# Patient Record
Sex: Female | Born: 1940 | Race: White | Hispanic: No | Smoking: Former smoker
Health system: Southern US, Community
[De-identification: ages and names within clinical notes are randomized; demographics above are authoritative.]

## PROBLEM LIST (undated history)

## (undated) DIAGNOSIS — C801 Malignant (primary) neoplasm, unspecified: Secondary | ICD-10-CM

## (undated) DIAGNOSIS — I350 Nonrheumatic aortic (valve) stenosis: Secondary | ICD-10-CM

## (undated) DIAGNOSIS — E669 Obesity, unspecified: Secondary | ICD-10-CM

## (undated) DIAGNOSIS — E7800 Pure hypercholesterolemia, unspecified: Secondary | ICD-10-CM

## (undated) DIAGNOSIS — T8859XA Other complications of anesthesia, initial encounter: Secondary | ICD-10-CM

## (undated) DIAGNOSIS — Z9289 Personal history of other medical treatment: Secondary | ICD-10-CM

## (undated) DIAGNOSIS — I10 Essential (primary) hypertension: Secondary | ICD-10-CM

## (undated) DIAGNOSIS — Z8042 Family history of malignant neoplasm of prostate: Secondary | ICD-10-CM

## (undated) DIAGNOSIS — M329 Systemic lupus erythematosus, unspecified: Secondary | ICD-10-CM

## (undated) DIAGNOSIS — R011 Cardiac murmur, unspecified: Secondary | ICD-10-CM

## (undated) DIAGNOSIS — Z9889 Other specified postprocedural states: Secondary | ICD-10-CM

## (undated) DIAGNOSIS — IMO0002 Reserved for concepts with insufficient information to code with codable children: Secondary | ICD-10-CM

## (undated) DIAGNOSIS — Z800 Family history of malignant neoplasm of digestive organs: Secondary | ICD-10-CM

## (undated) DIAGNOSIS — N189 Chronic kidney disease, unspecified: Secondary | ICD-10-CM

## (undated) DIAGNOSIS — I639 Cerebral infarction, unspecified: Secondary | ICD-10-CM

## (undated) DIAGNOSIS — Z803 Family history of malignant neoplasm of breast: Secondary | ICD-10-CM

## (undated) DIAGNOSIS — R519 Headache, unspecified: Secondary | ICD-10-CM

## (undated) DIAGNOSIS — J942 Hemothorax: Secondary | ICD-10-CM

## (undated) DIAGNOSIS — E039 Hypothyroidism, unspecified: Secondary | ICD-10-CM

## (undated) DIAGNOSIS — H9190 Unspecified hearing loss, unspecified ear: Secondary | ICD-10-CM

## (undated) DIAGNOSIS — I4891 Unspecified atrial fibrillation: Secondary | ICD-10-CM

## (undated) HISTORY — PX: HERNIA REPAIR: SHX51

## (undated) HISTORY — DX: Pure hypercholesterolemia, unspecified: E78.00

## (undated) HISTORY — PX: CHOLECYSTECTOMY: SHX55

## (undated) HISTORY — DX: Family history of malignant neoplasm of prostate: Z80.42

## (undated) HISTORY — PX: OTHER SURGICAL HISTORY: SHX169

## (undated) HISTORY — DX: Obesity, unspecified: E66.9

## (undated) HISTORY — DX: Family history of malignant neoplasm of breast: Z80.3

## (undated) HISTORY — DX: Unspecified atrial fibrillation: I48.91

## (undated) HISTORY — DX: Family history of malignant neoplasm of digestive organs: Z80.0

## (undated) HISTORY — DX: Hypothyroidism, unspecified: E03.9

## (undated) HISTORY — PX: TOE SURGERY: SHX1073

---

## 1999-12-24 ENCOUNTER — Other Ambulatory Visit: Admission: RE | Admit: 1999-12-24 | Discharge: 1999-12-24 | Payer: Self-pay | Admitting: Ophthalmology

## 2001-02-27 ENCOUNTER — Other Ambulatory Visit: Admission: RE | Admit: 2001-02-27 | Discharge: 2001-02-27 | Payer: Self-pay | Admitting: Family Medicine

## 2003-07-23 ENCOUNTER — Other Ambulatory Visit: Admission: RE | Admit: 2003-07-23 | Discharge: 2003-07-23 | Payer: Self-pay | Admitting: Family Medicine

## 2005-05-12 ENCOUNTER — Other Ambulatory Visit: Admission: RE | Admit: 2005-05-12 | Discharge: 2005-05-12 | Payer: Self-pay | Admitting: Family Medicine

## 2009-05-13 ENCOUNTER — Inpatient Hospital Stay (HOSPITAL_COMMUNITY): Admission: EM | Admit: 2009-05-13 | Discharge: 2009-05-15 | Payer: Self-pay | Admitting: Emergency Medicine

## 2009-05-13 ENCOUNTER — Ambulatory Visit: Payer: Self-pay | Admitting: Cardiology

## 2009-05-13 DIAGNOSIS — I4891 Unspecified atrial fibrillation: Secondary | ICD-10-CM

## 2009-05-13 HISTORY — DX: Unspecified atrial fibrillation: I48.91

## 2009-05-14 ENCOUNTER — Encounter (INDEPENDENT_AMBULATORY_CARE_PROVIDER_SITE_OTHER): Payer: Self-pay | Admitting: Internal Medicine

## 2009-06-25 ENCOUNTER — Ambulatory Visit: Payer: Self-pay | Admitting: Thoracic Surgery

## 2009-06-25 ENCOUNTER — Inpatient Hospital Stay (HOSPITAL_COMMUNITY): Admission: AD | Admit: 2009-06-25 | Discharge: 2009-07-02 | Payer: Self-pay | Admitting: Interventional Cardiology

## 2009-06-27 ENCOUNTER — Encounter (INDEPENDENT_AMBULATORY_CARE_PROVIDER_SITE_OTHER): Payer: Self-pay | Admitting: Interventional Cardiology

## 2009-07-08 ENCOUNTER — Ambulatory Visit: Payer: Self-pay | Admitting: Thoracic Surgery

## 2009-07-08 ENCOUNTER — Encounter: Admission: RE | Admit: 2009-07-08 | Discharge: 2009-07-08 | Payer: Self-pay | Admitting: Thoracic Surgery

## 2009-07-30 ENCOUNTER — Ambulatory Visit: Payer: Self-pay | Admitting: Thoracic Surgery

## 2009-07-30 ENCOUNTER — Encounter: Admission: RE | Admit: 2009-07-30 | Discharge: 2009-07-30 | Payer: Self-pay | Admitting: Thoracic Surgery

## 2009-10-01 ENCOUNTER — Encounter: Admission: RE | Admit: 2009-10-01 | Discharge: 2009-10-01 | Payer: Self-pay | Admitting: Thoracic Surgery

## 2009-10-01 ENCOUNTER — Ambulatory Visit: Payer: Self-pay | Admitting: Thoracic Surgery

## 2010-07-26 LAB — CBC
HCT: 34.9 % — ABNORMAL LOW (ref 36.0–46.0)
HCT: 36.5 % (ref 36.0–46.0)
Hemoglobin: 12.7 g/dL (ref 12.0–15.0)
MCV: 94.8 fL (ref 78.0–100.0)
MCV: 94.9 fL (ref 78.0–100.0)
MCV: 95.1 fL (ref 78.0–100.0)
MCV: 95.6 fL (ref 78.0–100.0)
Platelets: 281 10*3/uL (ref 150–400)
Platelets: 337 10*3/uL (ref 150–400)
RBC: 3.44 MIL/uL — ABNORMAL LOW (ref 3.87–5.11)
RBC: 3.67 MIL/uL — ABNORMAL LOW (ref 3.87–5.11)
RBC: 3.85 MIL/uL — ABNORMAL LOW (ref 3.87–5.11)
RDW: 13.2 % (ref 11.5–15.5)
WBC: 4.8 10*3/uL (ref 4.0–10.5)
WBC: 5.1 10*3/uL (ref 4.0–10.5)
WBC: 6.3 10*3/uL (ref 4.0–10.5)
WBC: 8 10*3/uL (ref 4.0–10.5)

## 2010-07-26 LAB — BASIC METABOLIC PANEL
BUN: 15 mg/dL (ref 6–23)
CO2: 28 mEq/L (ref 19–32)
Calcium: 8.8 mg/dL (ref 8.4–10.5)
Chloride: 100 mEq/L (ref 96–112)
Chloride: 103 mEq/L (ref 96–112)
Creatinine, Ser: 0.75 mg/dL (ref 0.4–1.2)
GFR calc Af Amer: >60 mL/min (ref >60)
GFR calc non Af Amer: >60 mL/min (ref >60)
Glucose, Bld: 131 mg/dL — ABNORMAL HIGH (ref 70–99)
Potassium: 3.2 mEq/L — ABNORMAL LOW (ref 3.5–5.1)
Sodium: 143 mEq/L (ref 135–145)

## 2010-07-26 LAB — PROTIME-INR
INR: 0.99 (ref 0.00–1.49)
Prothrombin Time: 13 seconds (ref 11.6–15.2)

## 2010-07-26 LAB — COMPREHENSIVE METABOLIC PANEL
AST: 21 U/L (ref 0–37)
Albumin: 2.6 g/dL — ABNORMAL LOW (ref 3.5–5.2)
BUN: 13 mg/dL (ref 6–23)
CO2: 28 mEq/L (ref 19–32)
Calcium: 8.8 mg/dL (ref 8.4–10.5)
Creatinine, Ser: 0.65 mg/dL (ref 0.4–1.2)
GFR calc Af Amer: >60 mL/min (ref >60)
GFR calc non Af Amer: >60 mL/min (ref >60)

## 2010-07-26 LAB — DIFFERENTIAL
Basophils Relative: 0 % (ref 0–1)
Eosinophils Absolute: 0 10*3/uL (ref 0.0–0.7)
Eosinophils Relative: 1 % (ref 0–5)
Lymphs Abs: 1.3 10*3/uL (ref 0.7–4.0)
Monocytes Relative: 13 % — ABNORMAL HIGH (ref 3–12)

## 2010-07-26 LAB — CK TOTAL AND CKMB (NOT AT ARMC): CK, MB: 4.7 ng/mL — ABNORMAL HIGH (ref 0.3–4.0)

## 2010-07-26 LAB — CARDIAC PANEL(CRET KIN+CKTOT+MB+TROPI)
Relative Index: 3.9 — ABNORMAL HIGH (ref 0.0–2.5)
Total CK: 105 U/L (ref 7–177)
Troponin I: 0.06 ng/mL (ref 0.00–0.06)

## 2010-07-26 LAB — LIPID PANEL
HDL: 43 mg/dL (ref >39)
LDL Cholesterol: 77 mg/dL (ref 0–99)
Triglycerides: 58 mg/dL (ref <150)

## 2010-07-26 LAB — MAGNESIUM: Magnesium: 2.1 mg/dL (ref 1.5–2.5)

## 2010-07-26 LAB — PHOSPHORUS: Phosphorus: 3.7 mg/dL (ref 2.3–4.6)

## 2010-07-30 LAB — CULTURE, RESPIRATORY W GRAM STAIN
Culture: NO GROWTH
Gram Stain: NONE SEEN

## 2010-07-30 LAB — CBC
HCT: 26.3 % — ABNORMAL LOW (ref 36.0–46.0)
HCT: 28.1 % — ABNORMAL LOW (ref 36.0–46.0)
HCT: 29.9 % — ABNORMAL LOW (ref 36.0–46.0)
HCT: 31.4 % — ABNORMAL LOW (ref 36.0–46.0)
Hemoglobin: 10.6 g/dL — ABNORMAL LOW (ref 12.0–15.0)
Hemoglobin: 9.5 g/dL — ABNORMAL LOW (ref 12.0–15.0)
MCHC: 33.6 g/dL (ref 30.0–36.0)
MCHC: 33.8 g/dL (ref 30.0–36.0)
MCHC: 34.2 g/dL (ref 30.0–36.0)
MCV: 87.2 fL (ref 78.0–100.0)
MCV: 87.4 fL (ref 78.0–100.0)
MCV: 88.8 fL (ref 78.0–100.0)
Platelets: 536 10*3/uL — ABNORMAL HIGH (ref 150–400)
Platelets: 575 10*3/uL — ABNORMAL HIGH (ref 150–400)
Platelets: 599 10*3/uL — ABNORMAL HIGH (ref 150–400)
RBC: 3.23 MIL/uL — ABNORMAL LOW (ref 3.87–5.11)
RBC: 3.25 MIL/uL — ABNORMAL LOW (ref 3.87–5.11)
RBC: 3.54 MIL/uL — ABNORMAL LOW (ref 3.87–5.11)
RDW: 17.5 % — ABNORMAL HIGH (ref 11.5–15.5)
RDW: 17.5 % — ABNORMAL HIGH (ref 11.5–15.5)
RDW: 17.9 % — ABNORMAL HIGH (ref 11.5–15.5)
RDW: 18.1 % — ABNORMAL HIGH (ref 11.5–15.5)
WBC: 10.3 10*3/uL (ref 4.0–10.5)
WBC: 10.6 10*3/uL — ABNORMAL HIGH (ref 4.0–10.5)
WBC: 11.3 10*3/uL — ABNORMAL HIGH (ref 4.0–10.5)
WBC: 12.6 10*3/uL — ABNORMAL HIGH (ref 4.0–10.5)

## 2010-07-30 LAB — CROSSMATCH

## 2010-07-30 LAB — BLOOD GAS, ARTERIAL
Bicarbonate: 27.6 mEq/L — ABNORMAL HIGH (ref 20.0–24.0)
O2 Saturation: 96.5 %
pCO2 arterial: 43 mmHg (ref 35.0–45.0)
pO2, Arterial: 76.1 mmHg — ABNORMAL LOW (ref 80.0–100.0)

## 2010-07-30 LAB — COMPREHENSIVE METABOLIC PANEL
AST: 28 U/L (ref 0–37)
Albumin: 1.8 g/dL — ABNORMAL LOW (ref 3.5–5.2)
Albumin: 2 g/dL — ABNORMAL LOW (ref 3.5–5.2)
Alkaline Phosphatase: 46 U/L (ref 39–117)
BUN: 13 mg/dL (ref 6–23)
BUN: 18 mg/dL (ref 6–23)
CO2: 30 mEq/L (ref 19–32)
Calcium: 8.2 mg/dL — ABNORMAL LOW (ref 8.4–10.5)
Chloride: 101 mEq/L (ref 96–112)
Chloride: 102 mEq/L (ref 96–112)
Creatinine, Ser: 0.92 mg/dL (ref 0.4–1.2)
Creatinine, Ser: 0.93 mg/dL (ref 0.4–1.2)
GFR calc Af Amer: >60 mL/min (ref >60)
GFR calc non Af Amer: 60 mL/min — ABNORMAL LOW (ref >60)
GFR calc non Af Amer: >60 mL/min (ref >60)
Potassium: 3.9 mEq/L (ref 3.5–5.1)
Total Bilirubin: 0.4 mg/dL (ref 0.3–1.2)
Total Bilirubin: 0.8 mg/dL (ref 0.3–1.2)

## 2010-07-30 LAB — GLUCOSE, CAPILLARY
Glucose-Capillary: 102 mg/dL — ABNORMAL HIGH (ref 70–99)
Glucose-Capillary: 114 mg/dL — ABNORMAL HIGH (ref 70–99)
Glucose-Capillary: 117 mg/dL — ABNORMAL HIGH (ref 70–99)
Glucose-Capillary: 122 mg/dL — ABNORMAL HIGH (ref 70–99)
Glucose-Capillary: 129 mg/dL — ABNORMAL HIGH (ref 70–99)
Glucose-Capillary: 130 mg/dL — ABNORMAL HIGH (ref 70–99)
Glucose-Capillary: 140 mg/dL — ABNORMAL HIGH (ref 70–99)
Glucose-Capillary: 141 mg/dL — ABNORMAL HIGH (ref 70–99)
Glucose-Capillary: 165 mg/dL — ABNORMAL HIGH (ref 70–99)
Glucose-Capillary: 71 mg/dL (ref 70–99)
Glucose-Capillary: 89 mg/dL (ref 70–99)

## 2010-07-30 LAB — BASIC METABOLIC PANEL
BUN: 15 mg/dL (ref 6–23)
BUN: 20 mg/dL (ref 6–23)
CO2: 28 mEq/L (ref 19–32)
Calcium: 8 mg/dL — ABNORMAL LOW (ref 8.4–10.5)
Calcium: 8.7 mg/dL (ref 8.4–10.5)
Chloride: 103 mEq/L (ref 96–112)
Creatinine, Ser: 0.9 mg/dL (ref 0.4–1.2)
GFR calc Af Amer: >60 mL/min (ref >60)
GFR calc Af Amer: >60 mL/min (ref >60)
GFR calc non Af Amer: >60 mL/min (ref >60)
GFR calc non Af Amer: >60 mL/min (ref >60)
GFR calc non Af Amer: >60 mL/min (ref >60)
Glucose, Bld: 104 mg/dL — ABNORMAL HIGH (ref 70–99)
Glucose, Bld: 201 mg/dL — ABNORMAL HIGH (ref 70–99)
Potassium: 3.8 mEq/L (ref 3.5–5.1)
Potassium: 3.9 mEq/L (ref 3.5–5.1)
Sodium: 137 mEq/L (ref 135–145)

## 2010-07-30 LAB — TISSUE CULTURE

## 2010-07-30 LAB — PROTIME-INR
INR: 1.53 — ABNORMAL HIGH (ref 0.00–1.49)
INR: 1.81 — ABNORMAL HIGH (ref 0.00–1.49)
INR: 2.51 — ABNORMAL HIGH (ref 0.00–1.49)
Prothrombin Time: 20.8 seconds — ABNORMAL HIGH (ref 11.6–15.2)
Prothrombin Time: 26.9 seconds — ABNORMAL HIGH (ref 11.6–15.2)

## 2010-07-30 LAB — APTT: aPTT: 45 seconds — ABNORMAL HIGH (ref 24–37)

## 2010-07-30 LAB — POCT I-STAT 7, (LYTES, BLD GAS, ICA,H+H)
Acid-Base Excess: 4 mmol/L — ABNORMAL HIGH (ref 0.0–2.0)
HCT: 25 % — ABNORMAL LOW (ref 36.0–46.0)
Hemoglobin: 8.5 g/dL — ABNORMAL LOW (ref 12.0–15.0)
Potassium: 4.1 mEq/L (ref 3.5–5.1)
Sodium: 138 mEq/L (ref 135–145)
TCO2: 31 mmol/L (ref 0–100)
pH, Arterial: 7.372 (ref 7.350–7.400)

## 2010-07-30 LAB — BRAIN NATRIURETIC PEPTIDE: Pro B Natriuretic peptide (BNP): 156 pg/mL — ABNORMAL HIGH (ref 0.0–100.0)

## 2010-07-30 LAB — PREPARE FRESH FROZEN PLASMA

## 2010-07-30 LAB — MRSA PCR SCREENING: MRSA by PCR: NEGATIVE

## 2010-08-06 ENCOUNTER — Inpatient Hospital Stay (HOSPITAL_COMMUNITY)
Admission: EM | Admit: 2010-08-06 | Discharge: 2010-08-11 | DRG: 093 | Disposition: A | Discharge status: Rehabilitation Facility | Payer: Medicare Other | Attending: Internal Medicine | Admitting: Internal Medicine

## 2010-08-06 ENCOUNTER — Emergency Department (HOSPITAL_COMMUNITY): Payer: Medicare Other

## 2010-08-06 DIAGNOSIS — I498 Other specified cardiac arrhythmias: Secondary | ICD-10-CM | POA: Diagnosis present

## 2010-08-06 DIAGNOSIS — I4891 Unspecified atrial fibrillation: Secondary | ICD-10-CM | POA: Diagnosis present

## 2010-08-06 DIAGNOSIS — D1802 Hemangioma of intracranial structures: Principal | ICD-10-CM | POA: Diagnosis present

## 2010-08-06 DIAGNOSIS — E039 Hypothyroidism, unspecified: Secondary | ICD-10-CM | POA: Diagnosis present

## 2010-08-06 DIAGNOSIS — R42 Dizziness and giddiness: Secondary | ICD-10-CM | POA: Diagnosis present

## 2010-08-06 DIAGNOSIS — R112 Nausea with vomiting, unspecified: Secondary | ICD-10-CM | POA: Diagnosis present

## 2010-08-06 DIAGNOSIS — I10 Essential (primary) hypertension: Secondary | ICD-10-CM | POA: Diagnosis present

## 2010-08-06 DIAGNOSIS — D638 Anemia in other chronic diseases classified elsewhere: Secondary | ICD-10-CM | POA: Diagnosis present

## 2010-08-06 LAB — POCT I-STAT, CHEM 8
BUN: 21 mg/dL (ref 6–23)
Chloride: 111 mEq/L (ref 96–112)
Creatinine, Ser: 0.9 mg/dL (ref 0.4–1.2)
Hemoglobin: 11.6 g/dL — ABNORMAL LOW (ref 12.0–15.0)
Potassium: 4.1 mEq/L (ref 3.5–5.1)
Sodium: 144 mEq/L (ref 135–145)

## 2010-08-06 MED ORDER — GADOBENATE DIMEGLUMINE 529 MG/ML IV SOLN
20.0000 mL | Freq: Once | INTRAVENOUS | Status: AC
  Administered 2010-08-06: 20 mL via INTRAVENOUS

## 2010-08-07 LAB — CBC
HCT: 32.6 % — ABNORMAL LOW (ref 36.0–46.0)
Hemoglobin: 10.8 g/dL — ABNORMAL LOW (ref 12.0–15.0)
MCV: 97.6 fL (ref 78.0–100.0)
Platelets: 190 10*3/uL (ref 150–400)
RBC: 3.34 MIL/uL — ABNORMAL LOW (ref 3.87–5.11)
WBC: 6.1 10*3/uL (ref 4.0–10.5)

## 2010-08-07 LAB — BASIC METABOLIC PANEL
BUN: 16 mg/dL (ref 6–23)
CO2: 27 mEq/L (ref 19–32)
Chloride: 113 mEq/L — ABNORMAL HIGH (ref 96–112)
Potassium: 3.6 mEq/L (ref 3.5–5.1)

## 2010-08-07 LAB — IRON AND TIBC
Iron: 49 ug/dL (ref 42–135)
UIBC: 174 ug/dL

## 2010-08-07 LAB — FERRITIN: Ferritin: 89 ng/mL (ref 10–291)

## 2010-08-07 LAB — FOLATE: Folate: 14.6 ng/mL

## 2010-08-07 NOTE — H&P (Signed)
NAME:  Sophia Woods, Sophia Woods                   ACCOUNT NO.:  [id number]  MEDICAL RECORD NO.:  [id number]           PATIENT TYPE:  E  LOCATION:  [facility]                         FACILITY:  [facility]  PHYSICIAN:  Zannie Cove, MD     DATE OF BIRTH:  19-Nov-1940  DATE OF ADMISSION:  08/06/2010 DATE OF DISCHARGE:                             HISTORY & PHYSICAL   PRIMARY CARE PHYSICIAN:  Dario Guardian, MD with [facility].  CHIEF COMPLAINT:  Dizziness and nausea.  HISTORY OF PRESENT ILLNESS:  [name] is a 70 year old female with chronic atrial fibrillation, hypertension, hypothyroidism, who presents to the hospital today with the above-mentioned complaints.  The patient reports that she started having worsening nausea and dizziness since Tuesday for the last 2 days and also had intermittent episodes of vomiting, which prompted her to come to the ER.  The patient reports that symptoms may have started close to about 4-5 days ago before worsening on Tuesday.  She denies any recent trauma.  She denies any weakness in her arms or legs.  She denies decreased sensations in her arms or legs.  She denies any deviation of the angle of mouth, slurring of her speech.  She denies visual disturbances.  She denies any seizures.  No recent changes in her medications.  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  PAST MEDICAL HISTORY:  Significant for, 1. Chronic atrial fibrillation. 2. Hypertension. 3. Dyslipidemia. 4. Hypothyroidism. 5. History of hemothorax status post decortication secondary to     coagulopathy from Coumadin. 6. Obesity.  PAST SURGICAL HISTORY:  Significant for hernia repair, tonsillectomy, and cholecystectomy.  SOCIAL HISTORY:  Quit smoking many years ago.  Denies any history of illicit  drug use and drinks alcohol very rarely.  FAMILY HISTORY:  Significant for the father deceased from intracranial bleed secondary to trauma and mother with heart failure.  ALLERGIES:  PENICILLIN.  MEDICATIONS:  Home medications yet to be confirmed include, 1. Aspirin 325 mg daily. 2. Calcium carbonate 500 mg daily. 3. Cardizem 360 mg daily. 4. Fish oil concentrate 1200 mg p.o. b.i.d. 5. Lasix 40 mg daily. 6. Levothyroxine 125 mcg daily. 7. Lipitor 20 mg every other day. 8. Metoprolol succinate 25 mg daily. 9. Potassium chloride 20 mEq daily.  PHYSICAL EXAMINATION:  VITAL SIGNS:  Temperature is 98, pulse is 62, blood pressure 134/77, respirations 20, sating 96% on 2 L. GENERAL:  She is a pleasant averagely built Caucasian female, lying in the dark room, in no acute distress.  The patient is uncomfortable with light on. HEENT:  Pupils 4 mm reactive to light. NECK:  No JVD, lymphadenopathy, or thyromegaly. CARDIOVASCULAR SYSTEM:  S1 and S2, regular rate and rhythm. LUNGS:  Clear to auscultation bilaterally. ABDOMEN:  Soft, obese, nontender.  Positive bowel sounds.  No organomegaly. EXTREMITIES:  No edema, clubbing, or cyanosis. NEURO:  Strength is 4/5 in all four extremities, both proximal and distal.  Sensations, light touch is intact all over.  Plantars are downgoing.  Deep tendon reflexes are 2+, upper and lower extremities. Coordination, no dysdiadochokinesia and finger-nose test normal as well. Gait is not assessed.  LABORATORY DATA: 1. Review of laboratory data shows H and H of 11.6 and 34.     Chemistries, sodium 144, potassium 4.1, chloride 111, bicarb 24,     BUN 21, creatinine 0.9, glucose 114. 2. MRI of the brain shows ovoid right middle cerebellar peduncle     lesions with surrounding edema and strong T2 shortening favor     cavernous angioma, which has recently bled.  Mild age-appropriate     atrophy and minimal white matter changes.  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  PLAN: 1. We will admit her to [facility] with close neuro checks and     repeat MRI in 24 to 48 hours or sooner if she has worsening     symptoms to rule out hydrocephalus.  Neurosurgery has also     consulted and Dr. Jeral Fruit will be following the patient.  We will     also be stopping her aspirin. 2. Nausea and vertigo secondary to her cerebellar bleed.  We will put     her on Phenergan p.r.n. as well as Zofran and meclizine if needed.     Also put on clear liquid diet to advance as tolerated. 3. Chronic atrial fibrillation, rate controlled.  We will continue     Toprol as well as Cardizem.  Stop her aspirin.  The patient's     Coumadin was discontinued due to right hemothorax last year.  Further management as condition evolves.     Zannie Cove, MD     PJ/MEDQ  D:  08/06/2010  T:  08/06/2010  Job:  [id number]  cc:   Dario Guardian, M.D.  Electronically Signed by Zannie Cove  on 08/07/2010 03:20:44 PM

## 2010-08-08 ENCOUNTER — Inpatient Hospital Stay (HOSPITAL_COMMUNITY): Payer: Medicare Other

## 2010-08-08 LAB — BASIC METABOLIC PANEL
BUN: 12 mg/dL (ref 6–23)
Calcium: 8.5 mg/dL (ref 8.4–10.5)
GFR calc non Af Amer: >60 mL/min (ref >60)
Glucose, Bld: 83 mg/dL (ref 70–99)

## 2010-08-08 LAB — CBC
HCT: 34.6 % — ABNORMAL LOW (ref 36.0–46.0)
MCHC: 32.7 g/dL (ref 30.0–36.0)
MCV: 96.4 fL (ref 78.0–100.0)
RDW: 12.3 % (ref 11.5–15.5)

## 2010-08-09 LAB — BASIC METABOLIC PANEL
BUN: 10 mg/dL (ref 6–23)
Creatinine, Ser: 0.79 mg/dL (ref 0.4–1.2)
GFR calc non Af Amer: >60 mL/min (ref >60)

## 2010-08-09 LAB — CBC
Platelets: 162 10*3/uL (ref 150–400)
RDW: 12.1 % (ref 11.5–15.5)
WBC: 5.6 10*3/uL (ref 4.0–10.5)

## 2010-08-10 DIAGNOSIS — I6330 Cerebral infarction due to thrombosis of unspecified cerebral artery: Secondary | ICD-10-CM

## 2010-08-10 LAB — BASIC METABOLIC PANEL
CO2: 26 mEq/L (ref 19–32)
Chloride: 108 mEq/L (ref 96–112)
GFR calc Af Amer: >60 mL/min (ref >60)
Potassium: 3.7 mEq/L (ref 3.5–5.1)
Sodium: 141 mEq/L (ref 135–145)

## 2010-08-10 LAB — CBC
Hemoglobin: 11.5 g/dL — ABNORMAL LOW (ref 12.0–15.0)
MCHC: 33.7 g/dL (ref 30.0–36.0)
RBC: 3.63 MIL/uL — ABNORMAL LOW (ref 3.87–5.11)

## 2010-08-11 ENCOUNTER — Inpatient Hospital Stay (HOSPITAL_COMMUNITY)
Admission: RE | Admit: 2010-08-11 | Discharge: 2010-08-21 | DRG: 946 | Disposition: A | Discharge status: Home / Self Care | Payer: Medicare Other | Source: Other Acute Inpatient Hospital | Attending: Physical Medicine & Rehabilitation | Admitting: Physical Medicine & Rehabilitation

## 2010-08-11 DIAGNOSIS — Z5189 Encounter for other specified aftercare: Principal | ICD-10-CM

## 2010-08-11 DIAGNOSIS — I619 Nontraumatic intracerebral hemorrhage, unspecified: Secondary | ICD-10-CM

## 2010-08-11 DIAGNOSIS — I4891 Unspecified atrial fibrillation: Secondary | ICD-10-CM | POA: Diagnosis present

## 2010-08-11 DIAGNOSIS — I69993 Ataxia following unspecified cerebrovascular disease: Secondary | ICD-10-CM

## 2010-08-11 DIAGNOSIS — I10 Essential (primary) hypertension: Secondary | ICD-10-CM | POA: Diagnosis present

## 2010-08-11 DIAGNOSIS — D1802 Hemangioma of intracranial structures: Secondary | ICD-10-CM | POA: Diagnosis present

## 2010-08-11 DIAGNOSIS — R110 Nausea: Secondary | ICD-10-CM | POA: Diagnosis present

## 2010-08-11 DIAGNOSIS — Z880 Allergy status to penicillin: Secondary | ICD-10-CM

## 2010-08-11 DIAGNOSIS — E876 Hypokalemia: Secondary | ICD-10-CM | POA: Diagnosis present

## 2010-08-12 LAB — DIFFERENTIAL
Basophils Relative: 1 % (ref 0–1)
Eosinophils Absolute: 0.5 10*3/uL (ref 0.0–0.7)
Eosinophils Relative: 9 % — ABNORMAL HIGH (ref 0–5)
Monocytes Relative: 12 % (ref 3–12)
Neutrophils Relative %: 62 % (ref 43–77)

## 2010-08-12 LAB — COMPREHENSIVE METABOLIC PANEL
ALT: 10 U/L (ref 0–35)
Alkaline Phosphatase: 36 U/L — ABNORMAL LOW (ref 39–117)
CO2: 28 mEq/L (ref 19–32)
GFR calc non Af Amer: >60 mL/min (ref >60)
Glucose, Bld: 94 mg/dL (ref 70–99)
Potassium: 3.5 mEq/L (ref 3.5–5.1)
Sodium: 140 mEq/L (ref 135–145)

## 2010-08-12 LAB — CBC
MCH: 31.6 pg (ref 26.0–34.0)
Platelets: 171 10*3/uL (ref 150–400)
RBC: 3.58 MIL/uL — ABNORMAL LOW (ref 3.87–5.11)
RDW: 12.6 % (ref 11.5–15.5)
WBC: 5.5 10*3/uL (ref 4.0–10.5)

## 2010-08-12 NOTE — Discharge Summary (Signed)
NAME:  Sophia Woods, Sophia Woods                   ACCOUNT NO.:  [id number]  MEDICAL RECORD NO.:  [id number]           PATIENT TYPE:  I  LOCATION:  3037                         FACILITY:  [facility]  PHYSICIAN:  Thad Ranger, MD       DATE OF BIRTH:  May 07, 1941  DATE OF ADMISSION:  08/06/2010 DATE OF DISCHARGE:  08/11/2010                              DISCHARGE SUMMARY   PRIMARY CARE PHYSICIAN:  Dario Guardian, MD with [facility].  DISCHARGE DIAGNOSES: 1. Right middle cerebellar hemangioma bleed. 2. Nausea and vertigo/dizziness secondary to cerebellar bleed. 3. Chronic atrial fibrillation with bradycardia. 4. Anemia of chronic disease. 5. Hypothyroidism. 6. Hypertension.  CONSULTATIONS:  Neurosurgery, Hilda Lias, MD  DISCHARGE MEDICATIONS: 1. Hydralazine 10 mg IV every 6 hours as needed for systolic blood     pressure above 160 or diastolic blood pressure above 161. 2. Meclizine 25 mg p.o. b.i.d. 3. Zofran 4 mg p.o. every 6 hours as needed for nausea/vomiting. 4. Phenergan 6.25 mg IV every 6 hours as needed for uncontrolled     nausea/vomiting. 5. Reglan 5 mg p.o. before meals and at bedtime. 6. Calcium carbonate/vitamin D 1 tablet p.o. b.i.d. 7. Fish oil 1200 mg p.o. daily. 8. Synthroid 125 mcg p.o. daily. 9. Lipitor 20 mg p.o. daily. 10.Potassium 20 mEq p.o. daily. 11.Ramipril 10 mg p.o. daily.  The following medications have been stopped; Cardizem, aspirin, Toprol- XL, and Lasix.  BRIEF HISTORY OF PRESENT ILLNESS:  At the time of admission, Sophia Woods is a 70 year old female with multiple medical problems as listed above. Started having worsening nausea and dizziness for the last 2 days prior to admission and had intermittent episodes of vomiting, which prompted her to come to the emergency room.  The patient denied any recent trauma or any weakness in her arms or legs.  She denied any decreased sensation in her extremities.  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  RADIOLOGICAL DATA:  MRI without and with contrast showed ovoid right middle cerebellar peduncle lesion medially adjacent to fourth ventricle with mild surrounding edema and strong T2 shortening favor cavernous angioma which has recently bled, mild age-appropriate atrophy with minimal white matter changes.  MRI of the brain without contrast on August 08, 2010, slight increase in size and change in signal characteristic of what appears to be right cerebellar cavernous angioma which has rebled with mild increase in amount of surrounding vasogenic edema and distortion of the fourth ventricle.  BRIEF HOSPITALIZATION COURSE:  Sophia Woods is a 70 year old female with a history of atrial fibrillation, hypertension, hypothyroidism presented with worsening nausea and vomiting and was found to have right middle cerebellar angioma bleed.  The patient was admitted to the Step-down Unit. 1. Right middle cerebellar peduncle angioma bleed.  The patient was     placed in the Step-down Unit for frequent neuro checks and closer     monitoring.  The patient was seen by Neurosurgery and recommended  no surgical intervention.  Aspirin was discontinued.  Since the     patient continued to complain of nausea with dizziness and balance     issues, MRI was repeated again on August 08, 2010.  It did show a     slight increase in the right cerebellar cavernous angioma with     vasogenic edema.  Per Neurosurgery, it is not a significant change     and does not require any acute surgical intervention.  Neurosurgery     closely followed the patient and she remained neurologically     stable.  She is improving and currently doing well on meclizine,     Reglan, and p.r.n. Phenergan.  The patient also underwent PT/OT     evaluation and felt to be a good candidate for inpatient rehab.   I     recommend to keep IV Phenergan p.r.n. in case of uncontrolled     nausea and vomiting; however, it has been significantly improved     and the patient is currently tolerating diet. 2. Hypertension, currently stable.  Metoprolol and Cardizem were     discontinued from the patient's outpatient medication profile     secondary to bradycardia and the blood pressure has been stable on     Altace.  I also recommended to continue IV p.r.n. hydralazine for     another 24-48 hours for systolic BP above 160 or diastolic BP above     100. 3. Atrial fibrillation, rate controlled.  The patient actually had     heart rate in 50s and diltiazem and Toprol has been held and     discontinued.  Aspirin also has been discontinued due to the     cerebellar bleed.  The patient will be discharged to inpatient     rehab today.  PHYSICAL EXAMINATION:  VITAL SIGNS:  At the time of discharge, temperature 98.7, pulse 56, respirations 18, blood pressure 132/55, and O2 sats 93% on room air. GENERAL:  The patient is alert, awake and oriented x3, not in acute distress. HEENT:  Anicteric sclerae.  Conjunctivae clear.  Pupils are reactive to light and accommodation.  EOMI. NECK:  Supple.  No lymphopathy. CVS:  S1 and S2 clear. CHEST:  Clear to auscultation bilaterally. ABDOMEN:  Soft, nontender, nondistended.  Normal bowel sounds. EXTREMITIES:  No cyanosis, clubbing, or edema noted in upper or lower extremities bilaterally.  DISCHARGE FOLLOWUP:  Follow up with Dr. Merri Brunette after the discharge and Dr. Jeral Fruit within next 2 weeks.  DISCHARGE TIME:  35 minutes.     Thad Ranger, MD     RR/MEDQ  D:  08/11/2010  T:  08/11/2010  Job:  [id number]  cc:   Hilda Lias, M.D. Dario Guardian, M.D.  Electronically Signed by Andres Labrum Kathyrn Warmuth  on 08/12/2010 04:46:34 PM

## 2010-08-13 NOTE — Consult Note (Signed)
  NAME:  Sophia Woods, Sophia Woods                   ACCOUNT NO.:  [id number]  MEDICAL RECORD NO.:  [id number]           PATIENT TYPE:  I  LOCATION:  3307                         FACILITY:  [facility]  PHYSICIAN:  Hilda Lias, M.D.   DATE OF BIRTH:  Jan 25, 1941  DATE OF CONSULTATION:  08/06/2010 DATE OF DISCHARGE:                                CONSULTATION   Sophia Woods is a 70 years old female who came to the hospital after having 2 days complaint of dizziness.  She also has a history of nausea and vomiting and according to her niece for the has 2-3 months, they have noted some tremors mostly in the arm.  In the hospital, she was seen by the ER doctor.  MRI was obtained.  We were called for evaluation.  She has a mild headache.  She denies any history of seizure.  Right now, she is complaining of feeling of nausea and dizziness.  In addition, she feels her head is like swimming head.  PAST MEDICAL HISTORY:  She has a history of bleeding secondary to anticoagulants with thoracotomy last year.  Right now, she is taking aspirin, Crestor, potassium, Lasix, fish oil.  PHYSICAL EXAMINATION:  VITAL SIGNS:  Blood pressure 150/60, the pulse is fluctuating between 50 and 60. NEUROLOGIC:  Mentally, she is awake, oriented x3.  Cranial nerve, the pupils are equal and reactive, 3 mm.  She has a full ocular movement. Face is symmetrical.  Swallow is normal.  The strength is normal in the upper and lower extremities.  Reflexes are symmetrical and normal. Coordination finger-to-finger, finger-to-nose is normal.  I did not test her gait.  The MRI showed that she has a lesion in the right cerebellum at the level of the peduncle with slight displacement of the fourth ventricle. By the MRI, it seemed that already the lesion bled.  There is no evidence of any hydrocephalus.  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  RECOMMENDATIONS:  I talked to the patient and talked to  her niece at length, and later on to the emergency room doctor.  The plan is for the lady to be need to be admitted.  She had multiple medical problems, and I will be acting as a Research scientist (medical).  My recommendation is to repeat the CT or an MRI in the next 48 hours or before as needed just to rule out the possibility of hydrocephalus.  As already the lesion bled, and there is no need to do any surgical intervention.  The question is what to do next.  Most likely by advised is not to do anything, but I will be open to send Ms. Sophia Woods for a second independent opinion involving the [facility].          ______________________________ Hilda Lias, M.D.     EB/MEDQ  D:  08/06/2010  T:  08/07/2010  Job:  [id number]  Electronically Signed by Hilda Lias M.D. on 08/13/2010 06:14:41 PM

## 2010-08-14 DIAGNOSIS — I619 Nontraumatic intracerebral hemorrhage, unspecified: Secondary | ICD-10-CM

## 2010-08-14 DIAGNOSIS — Z5189 Encounter for other specified aftercare: Secondary | ICD-10-CM

## 2010-08-14 DIAGNOSIS — I69993 Ataxia following unspecified cerebrovascular disease: Secondary | ICD-10-CM

## 2010-08-17 LAB — CLOSTRIDIUM DIFFICILE BY PCR: Toxigenic C. Difficile by PCR: NEGATIVE

## 2010-08-19 DIAGNOSIS — I69993 Ataxia following unspecified cerebrovascular disease: Secondary | ICD-10-CM

## 2010-08-19 DIAGNOSIS — I619 Nontraumatic intracerebral hemorrhage, unspecified: Secondary | ICD-10-CM

## 2010-08-19 DIAGNOSIS — Z5189 Encounter for other specified aftercare: Secondary | ICD-10-CM

## 2010-08-21 DIAGNOSIS — Z5189 Encounter for other specified aftercare: Secondary | ICD-10-CM

## 2010-08-21 DIAGNOSIS — I619 Nontraumatic intracerebral hemorrhage, unspecified: Secondary | ICD-10-CM

## 2010-08-21 DIAGNOSIS — I69993 Ataxia following unspecified cerebrovascular disease: Secondary | ICD-10-CM

## 2010-08-25 ENCOUNTER — Ambulatory Visit: Payer: Medicare Other | Attending: Physical Medicine & Rehabilitation | Admitting: Physical Therapy

## 2010-08-25 ENCOUNTER — Ambulatory Visit: Payer: Medicare Other | Admitting: Occupational Therapy

## 2010-08-25 DIAGNOSIS — M6281 Muscle weakness (generalized): Secondary | ICD-10-CM | POA: Insufficient documentation

## 2010-08-25 DIAGNOSIS — R279 Unspecified lack of coordination: Secondary | ICD-10-CM | POA: Insufficient documentation

## 2010-08-25 DIAGNOSIS — R5381 Other malaise: Secondary | ICD-10-CM | POA: Insufficient documentation

## 2010-08-25 DIAGNOSIS — Z5189 Encounter for other specified aftercare: Secondary | ICD-10-CM | POA: Insufficient documentation

## 2010-08-25 DIAGNOSIS — I69919 Unspecified symptoms and signs involving cognitive functions following unspecified cerebrovascular disease: Secondary | ICD-10-CM | POA: Insufficient documentation

## 2010-08-25 DIAGNOSIS — R269 Unspecified abnormalities of gait and mobility: Secondary | ICD-10-CM | POA: Insufficient documentation

## 2010-08-25 DIAGNOSIS — I69998 Other sequelae following unspecified cerebrovascular disease: Secondary | ICD-10-CM | POA: Insufficient documentation

## 2010-08-31 ENCOUNTER — Ambulatory Visit: Payer: Medicare Other | Admitting: Occupational Therapy

## 2010-09-01 ENCOUNTER — Encounter: Payer: Medicare Other | Admitting: Physical Therapy

## 2010-09-01 ENCOUNTER — Ambulatory Visit: Payer: Medicare Other | Admitting: Physical Therapy

## 2010-09-04 ENCOUNTER — Ambulatory Visit: Payer: Medicare Other | Admitting: Physical Therapy

## 2010-09-04 ENCOUNTER — Ambulatory Visit: Payer: Medicare Other | Admitting: Occupational Therapy

## 2010-09-07 ENCOUNTER — Ambulatory Visit: Payer: Medicare Other | Admitting: Physical Therapy

## 2010-09-07 ENCOUNTER — Ambulatory Visit: Payer: Medicare Other | Admitting: Occupational Therapy

## 2010-09-09 ENCOUNTER — Ambulatory Visit: Payer: Medicare Other | Attending: Physical Medicine & Rehabilitation | Admitting: Physical Therapy

## 2010-09-09 ENCOUNTER — Ambulatory Visit: Payer: Medicare Other | Admitting: Occupational Therapy

## 2010-09-09 DIAGNOSIS — I69998 Other sequelae following unspecified cerebrovascular disease: Secondary | ICD-10-CM | POA: Insufficient documentation

## 2010-09-09 DIAGNOSIS — R279 Unspecified lack of coordination: Secondary | ICD-10-CM | POA: Insufficient documentation

## 2010-09-09 DIAGNOSIS — Z5189 Encounter for other specified aftercare: Secondary | ICD-10-CM | POA: Insufficient documentation

## 2010-09-09 DIAGNOSIS — I69919 Unspecified symptoms and signs involving cognitive functions following unspecified cerebrovascular disease: Secondary | ICD-10-CM | POA: Insufficient documentation

## 2010-09-09 DIAGNOSIS — M6281 Muscle weakness (generalized): Secondary | ICD-10-CM | POA: Insufficient documentation

## 2010-09-09 DIAGNOSIS — R269 Unspecified abnormalities of gait and mobility: Secondary | ICD-10-CM | POA: Insufficient documentation

## 2010-09-09 DIAGNOSIS — R5381 Other malaise: Secondary | ICD-10-CM | POA: Insufficient documentation

## 2010-09-09 NOTE — Discharge Summary (Signed)
NAME:  Sophia Woods, Sophia Woods                   ACCOUNT NO.:  [id number]  MEDICAL RECORD NO.:  [id number]           PATIENT TYPE:  I  LOCATION:  4027                         FACILITY:  [facility]  PHYSICIAN:  Ranelle Oyster, M.D.DATE OF BIRTH:  [birth year]  DATE OF ADMISSION:  08/11/2010 DATE OF DISCHARGE:  08/21/2010                              DISCHARGE SUMMARY   DISCHARGE DIAGNOSIS: 1. Left middle cerebellar peduncle angioma with bleed. 2. Hypertension. 3. History of atrial fibrillation. 4. Cellulitis, left arm resolving. 5. Nausea, improving.  HISTORY OF PRESENT ILLNESS:  Sophia Woods is a 70 year old female with history of hypertension, chronic AFib, admitted on August 06, 2010, with worsening dizziness and nausea x2 days with intermittent episodes of vomiting.  She was evaluated by Dr. Jeral Fruit, as MRI of brain done revealed right middle cerebellar peduncle lesion with favoring cavernous angioma, which had recently bled.  Neurosurgery recommends monitoring and no intervention unless hydrocephalus develops.  Followup MRI of August 08, 2010, showed slight increase in bleed with mild increase in vasogenic edema.  The patient was started on Zofran as well as scheduled meclizine to help with her overall symptomatology.  Therapies initiated, and the patient is noted to have issues with dizziness with head turns. Also noted to have mild ataxia of bilateral lower extremity.  The patient was evaluated by rehab and felt that she would benefit from a CIR program.  PAST MEDICAL HISTORY:  Significant for hypertension, AFib, hypokalemia, hypothyroid, dyslipidemia, varicose veins, obesity, umbilical hernia repair, glaucoma surgery, history of hemothorax with VATS in February 2011 and right hammertoes repair.  ALLERGIES:  PENICILLIN.  REVIEW OF SYMPTOMS:  Positive for nausea as well as dizziness.  No complaints of headache.  FAMILY HISTORY:  Positive for coronary artery disease, Raynaud's phenomenon,  and intracerebral hemorrhage.  SOCIAL HISTORY:  The patient lives alone in 1-level home with one step at entry.  History of 15-pack-year tobacco use, quit in 1975.  Uses alcohol rarely.  She has family in town that can assist past discharge.  FUNCTIONAL HISTORY:  The patient was independent without any assisted device prior to admission.  She still drives.  FUNCTIONAL STATUS:  The patient is min guard assist transfers, min assist ambulating 100 feet with rolling walker, min assist upper body care, mod assist lower body bathing, and min assist to supervision for dressing.  PHYSICAL EXAMINATION:  VITAL SIGNS:  Blood pressure 132/55, pulse 56, respiratory rate 18, temperature 98.7. GENERAL:  The patient is well-nourished, well-developed female alert and in no acute distress. HEENT:  Pupils equal, round, and reactive to light.  She has a set of dentures on top and partial in the bottom.  She has some thrush on tongue.  Nares patent. NECK:  Supple without JVD or lymphadenopathy. LUNGS:  Clear to auscultation bilaterally without wheezes, rales, or rhonchi. HEART:  Regular rate and rhythm without murmurs, gallops, or rubs. ABDOMEN:  Soft, nontender with positive bowel sounds. EXTREMITIES:  No evidence no evidence of clubbing, cyanosis, or edema. NEUROLOGIC:  Cranial nerves II through XII without any difficulties in extraocular movement.  No nystagmus.  Visual  acuity I feel is reasonable.  Reflexes 1+.  Sensation grossly intact.  She has some mild decrease in fine motor coordination on right.  Memory is appropriate. Mood is pleasant.  She is oriented x3.  Strength is 4/5 in upper extremity.  She is 2+to 3/5 at hip flexures, 4/5 at right knee and ankle today.  HOSPITAL COURSE:  Sophia Woods was admitted to rehab on August 11, 2010, for inpatient therapies to consist of PT, OT at least 3 hours 5 days a week.  Past admission, physiatrist, rehab RN, and therapy team have worked together to  provide customized collaborative interdisciplinary care.  The patient's blood pressure was monitored on b.i.d. basis. Initially, the patient on Altace alone for blood pressure management. Her heart rate was monitored with close monitoring for symptoms for any recurrence tachycardia.  Toprol-XL 25 mg p.o. per day was resumed.  The patient's blood pressures were slowly noted to be on an upward trend with systolics in 140s to 180s.  Her Cardizem was resumed at 30 mg p.o. b.i.d.Marland Kitchen  Blood pressures at time of discharge was ranging from 140s-110s systolic, 60s-70s diastolic.  Heart rate has been stable in 60s to 70s range.  Routine labs were done past admission revealing hemoglobin 11.3, hematocrit 33.9, white count 5.5, platelets 171.  Check of lytes revealed sodium 140, potassium 3.5, chloride 107, CO2 of 28, BUN 5, creatinine 0.78, glucose 94.  LFTs revealed alkaline phosphatase at 36, SGOT at 11, SGPT 10, total protein 5.2, albumin at 2.7.  The patient did have some issues with diarrhea, therefore, C. diff was checked, and this was noted to be negative.  The patient was taken off meclizine and slowly tapered of Zofran to help decrease sedative effects of these medicines.  Her nausea overall is greatly improved.  Currently, Phenergan is working better than Zofran for her nausea symptoms.  She is to continue this on p.r.n. basis past discharge.  Vestibular eval was done past admission once the patient was able to tolerate the testing. 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  She had difficulty with upward head movements and conjugate head and eye movements with decreased ability to suppress the VOR.  Nystagmus initially to left then to right and upward. PT, OT has worked with the patient in addressing habituation as well as using compensatory techniques during her different therapy sessions to help decrease her symptomatology  and decrease risk of fall.  At admission, the patient was min assist for transfers, min assist for ambulating short distances, required +2 assist without assistive device. Berg score was noted to be at 24/56 indicating the patient at high risk for falls.  The patient has made improvement in her strength, balance, as well as mobility on stay.  She currently requires supervision for ambulation as well as navigation of stairs.  She is modified independent for transfers.  Berg score has improved greatly to 32/56.  She continues to require supervision for mobility.  Her sister has been educated regarding providing supervision and safety concerns.  Further followup outpatient physical therapy to continue past discharge.  OT has been working with the patient on self-care tasks.  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  She was noted to have decreased activity tolerance, decrease in core strength, as well as issues with poor coordination and decrease in standing and then dynamic sitting balance.  Her ongoing dizziness, nausea with positional changes impacted her  ability to carry out self-care tasks.  The patient required multiple rest breaks to complete her ADL tasks initially.  She was also at min assist overall for self-care tasks.  She has currently made good progress and is modified independent level for bathing, dressing, toileting as well as toilet transfers.  Dynamic standing balance during ADLs has improved greatly.  She does require supervision for tub transfers and simple meal preparation.  The patient's sister has been educated regarding current level of function and is to provide supervision as needed to during self-care tasks.  Further outpatient PT, OT to begin at [facility] beginning August 25, 2010, at 8:15 a.m..  On August 21, 2010, the patient was discharged to home.  Of note, the patient was noted to develop some erythema in the right  triceps areas past admission due to superficial cellulitis.  She was started on doxycycline for treatment and is to continue this for 10 total days of antibiotic therapy.  DISCHARGE MEDICATIONS: 1. Diltiazem 30 mg p.o. b.i.d. 2. Doxycycline 100 mg p.o. at bedtime. 3. Creatinine 10 mg p.o. per day. 4. Phenergan 12.5-25 mg p.o. q.6 h. p.r.n. nausea. 5. Os-Cal plus D b.i.d. 6. Levothyroxine 125 mcg p.o. per day. 7. Lipitor 40 mg per day. 8. Metoprolol 25 mg p.o. per day. 9. Altace 10 mg p.o. per day.  DIET:  Low salt.  Activity level is 24-hour supervision for now.  No strenuous activity. No alcohol, no smoking, no driving.  SPECIAL INSTRUCTIONS:  Do not use fish oil, furosemide, KCl, coated aspirin, or Cardizem CD 360 mg.  [facility] outpatient neuro rehab to begin on August 25, 2010 at 8:15 to last through 9:45 a.m.  FOLLOWUP:  The patient to follow up with Dr. Riley Kill on Sep 18, 2010, at 11:20 for 12:40 appointment.  Follow up with Dr. Merri Brunette in 2-3 weeks.  Follow up with Dr. Jeral Fruit in 3-4 weeks.     Delle Reining, P.A.   ______________________________ Ranelle Oyster, M.D.    PL/MEDQ  D:  08/21/2010  T:  08/22/2010  Job:  [id number]  cc:   Dario Guardian, M.D. Hilda Lias, M.D.  Electronically Signed by Osvaldo Shipper. on 08/31/2010 02:48:14 PM Electronically Signed by Faith Rogue M.D. on 09/09/2010 09:50:05 PM

## 2010-09-09 NOTE — H&P (Signed)
NAME:  Woods, Sophia                   ACCOUNT NO.:  [id number]  MEDICAL RECORD NO.:  [id number]           PATIENT TYPE:  I  LOCATION:  4027                         FACILITY:  [facility]  PHYSICIAN:  Ranelle Oyster, M.D.DATE OF BIRTH:  [birth year]  DATE OF ADMISSION:  08/11/2010 DATE OF DISCHARGE:                             HISTORY & PHYSICAL   CHIEF COMPLAINT:  Balance and dizziness, nausea.  PRIMARY PHYSICIAN:  Dario Guardian, MD  HISTORY OF PRESENT ILLNESS:  This is a pleasant 70 year old white female with hypertension, AFib who was admitted on August 06, 2010, with dizziness and nausea times 2 days with vomiting.  MRI of the brain was done revealing a right middle cerebellar peduncle lesion.  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  Monitoring was recommended unless hydrocephalous develop.  Follow up MRI on August 08, 2010, showed slight increase in size of the bleed, but in mild increased vasogenic edema.  She was started on Zofran and meclizine to help with symptoms.  Rehab was consulted and felt she could benefit from an inpatient stay.  REVIEW OF SYSTEMS:  Notable for decreased hearing, nausea, dizziness. Other pertinent positives above and full 12-point review is in the written H and P.  PAST MEDICAL HISTORY:  Positive for hypertension, AFib, hypokalemia, hypothyroidism, dyslipidemia, obesity, umbilical hernia repair, glaucoma surgery, hemothorax, status post VATS, right hammertoe repair.  FAMILY HISTORY:  Positive for CAD, intracranial hemorrhage, Raynaud phenomenon.  SOCIAL HISTORY:  The patient lives alone in 1-level house with 1 step to enter.  Tobacco was used for 15 years, and she quit in 1975.  She rarely drinks.  ALLERGIES:  PENICILLIN.  HOME MEDICATIONS AND LABORATORY DATA:  Please see written H and P.  PHYSICAL EXAMINATION:  VITAL SIGNS:  Blood pressure is 132/55, pulse 56, respiratory rate 18, temperature 98.7. GENERAL:  The  patient is pleasant, alert, oriented x3.  Pupils equal, round, and reactive to light. HEENT:  She wears dentures on top and a partial in the bottom.  She has some thrush over the tongue. NECK:  Supple without JVD or lymphadenopathy. CHEST:  Clear to auscultation bilaterally without wheezes, rales, or rhonchi. HEART:  Regular rate and rhythm without murmur, rubs, or gallops. ABDOMEN:  Soft, nontender.  Bowel sounds are positive. SKIN:  Generally intact and without signs of breakdown. NEUROLOGIC:  Cranial nerves II through XII showed no difficulties in extraocular eye movements.  She had no nystagmus.  Visual acuity and fields were reasonable.  Reflexes were 1+.  Sensation is grossly intact. The patient has some mild decrease in fine motor coordination of the right side.  She is oriented x3.  Memory is appropriate.  Mood is pleasant.  Strength is 4/5 in upper extremities.  She is 2+ to 3/5 at the hip flexors, 4/5 at the knee and ankle today.  POSTADMISSION PHYSICIAN EVALUATION: 1. 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  The patient with ataxia and     decreased fine motor movements as well as gait instability. 2. The patient  is admitted to receive collaborative interdisciplinary     care between the physiatrist, rehab nursing staff, and therapy     team. 3. The patient's level of medical complexity and substantial therapy     needs.  In context of that, medical necessity cannot be provided at     a lesser intensity of care. 4. The patient has experienced substantial functional loss from her     baseline.  Upon functional assessment at the time of preadmission     screening the patient was min to guard assist ambulating 100 feet,     min to guard assist transfer, min assist upper body bathing, mod     assist lower body bathing, min assist supervision with dressing.     Judging by the patient's diagnosis, physical exam, and  functional     history, she has a potential for functional progress, which result     in measurable gains while inpatient rehab.  Gains will be of     substantial and practical use upon discharge to home in     facilitating mobility and self-care. 5. Physiatrist will provide 24-hour management of medical needs as     well as oversight of the therapy plan/treatment and provide     guidance as appropriate regarding interaction of the two.  Medical     problem list and plan are below. 6. A 24-hour rehab nursing team will assist in the management of the     patient's skin care, pain, integration of therapy concepts,     techniques, nutrition, bowel and bladder function, etc. 7. PT will assess and treat for lower extremity strength, range of     motion, balance, coordination of the limbs, adaptive techniques,     equipment, equipment goals modified independent. 8. OT will assess and treat for upper extremity use, ADLs, adaptive     techniques, equipment, functional mobility, safety, with goals     modified independent. 9. Case management and social worker will assess and treat for     psychosocial issues and discharge planning. 10.Team conference be held weekly to assess progress towards goals and     to determine barriers at discharge. 11.The patient has demonstrated sufficient medical stability and     exercise capacity to tolerate at least 3 hours therapy per day at     least 5 days per week. 12.Estimated length of stay is 7 days.  Prognosis is good.  MEDICAL PROBLEM LIST AND PLAN: 1. Deep vein thrombosis prophylaxis with SCDs. Lovenox is contraindicated due to bleed. 2. Atrial fibrillation:  The patient is off rate control medications     due to bradycardia.  We will follow if her heart rate remains in     the 50s or 60s. 3. Hypokalemia:  Supplement and follow.  We will recheck labs in the     morning. 4. Hypertension:  The patient is on Altace daily.  She is off Toprol     and  Cardizem due to rate control with good     control blood pressure at present. 5. Nausea and vestibular symptoms:  Meclizine and Zofran p.r.n.  We     would like to minimize these due to the neuro-sedating effects.     Ranelle Oyster, M.D.     ZTS/MEDQ  D:  08/11/2010  T:  08/12/2010  Job:  [id number]  cc:   Dario Guardian, M.D.  Electronically Signed by Faith Rogue M.D. on 09/09/2010 09:50:56 PM

## 2010-09-10 ENCOUNTER — Ambulatory Visit: Payer: Medicare Other | Admitting: Physical Therapy

## 2010-09-10 ENCOUNTER — Ambulatory Visit: Payer: Medicare Other | Admitting: Occupational Therapy

## 2010-09-14 ENCOUNTER — Ambulatory Visit: Payer: Medicare Other | Admitting: Occupational Therapy

## 2010-09-14 ENCOUNTER — Ambulatory Visit: Payer: Medicare Other | Admitting: Physical Therapy

## 2010-09-16 ENCOUNTER — Ambulatory Visit: Payer: Medicare Other | Admitting: Physical Therapy

## 2010-09-16 ENCOUNTER — Ambulatory Visit: Payer: Medicare Other | Admitting: Occupational Therapy

## 2010-09-18 ENCOUNTER — Other Ambulatory Visit: Payer: Self-pay | Admitting: Neurosurgery

## 2010-09-18 ENCOUNTER — Encounter: Payer: Medicare Other | Admitting: Physical Therapy

## 2010-09-18 ENCOUNTER — Ambulatory Visit: Payer: Medicare Other | Admitting: Occupational Therapy

## 2010-09-18 ENCOUNTER — Encounter: Payer: Medicare Other | Attending: Physical Medicine & Rehabilitation | Admitting: Physical Medicine & Rehabilitation

## 2010-09-18 ENCOUNTER — Ambulatory Visit: Payer: Medicare Other | Admitting: Physical Therapy

## 2010-09-18 DIAGNOSIS — R42 Dizziness and giddiness: Secondary | ICD-10-CM

## 2010-09-18 DIAGNOSIS — I69998 Other sequelae following unspecified cerebrovascular disease: Secondary | ICD-10-CM

## 2010-09-18 DIAGNOSIS — I69993 Ataxia following unspecified cerebrovascular disease: Secondary | ICD-10-CM

## 2010-09-18 DIAGNOSIS — D1802 Hemangioma of intracranial structures: Secondary | ICD-10-CM | POA: Insufficient documentation

## 2010-09-18 DIAGNOSIS — I619 Nontraumatic intracerebral hemorrhage, unspecified: Secondary | ICD-10-CM | POA: Insufficient documentation

## 2010-09-18 DIAGNOSIS — D332 Benign neoplasm of brain, unspecified: Secondary | ICD-10-CM

## 2010-09-18 DIAGNOSIS — R279 Unspecified lack of coordination: Secondary | ICD-10-CM | POA: Insufficient documentation

## 2010-09-18 DIAGNOSIS — R110 Nausea: Secondary | ICD-10-CM | POA: Insufficient documentation

## 2010-09-21 ENCOUNTER — Ambulatory Visit: Payer: Medicare Other | Admitting: Physical Therapy

## 2010-09-21 ENCOUNTER — Ambulatory Visit: Payer: Medicare Other | Admitting: Occupational Therapy

## 2010-09-21 NOTE — Assessment & Plan Note (Signed)
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 She has had outpatient therapy, currently doing well.  She is still having some problems with balance, occasional vertigo.  She notes some vertigo when she changes position and sometimes when she is in the car. She will tend to lean to the right when she walks.  Therapy is trying to progress her to a cane.  She uses the walker for longer distances outside the home.  REVIEW OF SYSTEMS:  Notable for the above as well as occasional nausea. Full 12-point review is in the written health and history section of chart.  Denies pain.  SOCIAL HISTORY:  The patient is divorced, living with her daughter currently but wants to move back home.  She rarely is drinking and has not drunk since her hospitalization.  PHYSICAL EXAMINATION:  VITAL SIGNS:  Blood pressure is 146/50, pulse 79, respiratory rate 18, and she is saturating 96% on room air. GENERAL:  The patient is pleasant, alert, and oriented x3.  Affect is generally bright and appropriate. NEUROLOGIC:  Cranial nerve exam is unremarkable.  She has intact coordination of both upper extremities.  I saw no limb ataxia.  She had some difficulty lifting the left shoulder due to an old rotator cuff problem.  Lower extremity strength is also intact with normal coordination individually.  Her strength overall was 4+ to 5/5 throughout.  In standing today, she did lean a bit to the right, was very stable and I walked her without her walker, she did quite nicely. We tried heel-to-toe ambulation and did have some loss of balance to the right.  She did very well with changes in direction.  Cognitively, she is alert and appropriate with good insight, awareness, etc. HEART:  Regular rate and rhythm. CHEST:  Clear. ABDOMEN:  Soft and nontender.  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  PLAN: 1. The patient  is doing extremely well.  She will continue with     therapy per their discretion.  I think she is moving to the point     where she is ready for home exercise program.  I definitely support     moving to the cane. 2. I gave her permission to return to work on a part-time basis, i.e.,     1 or 2 days a week initially and then titrating up thereafter.  She     is only working 25 hours a week beforehand.  She is primarily doing     sedentary work at her job. 3. I think she is ready to return home alone. 4. I did not give her permission to drive and will not unless she is     vertigo free while in the car. 5. She can come back to see me on an as-needed basis.  She will follow     up with Dr. Merri Brunette regarding her general medical needs.     Ranelle Oyster, M.D. Electronically Signed    ZTS/MedQ D:  09/18/2010 13:34:13  T:  09/19/2010 00:53:04  Job #:  [id number]  cc:   Dario Guardian, M.D. Fax: [phone]

## 2010-09-22 NOTE — Assessment & Plan Note (Signed)
OFFICE VISIT   Stockman, Marylu P  DOB:  1940-07-23                                        July 08, 2009  CHART #:  [id number]   HISTORY:  The patient is a 70 year old white female, who is status post  left video-assisted thoracoscopic surgery and drainage of a hemothorax  with decortication, which she developed secondary to over  anticoagulation with Coumadin.  She presented early February with an INR  of 7.7 and a significant drop in her hemoglobin from 12.5-9.5.  She did  well postoperatively and currently, she states that she is feeling  better, although she does note some discomfort in the left base.  She  also has some cough, but it is nonproductive at this time.  She denies  fevers, chills, or other constitutional symptoms.   Chest x-ray was obtained on today's date.  It does show some  inflammatory and atelectatic changes in the left base with small  effusion.  It is quite stable in appearance.  There are no new  additional findings.   PHYSICAL EXAMINATION:  VITAL SIGNS:  Blood pressure 116/68, pulse 75,  respirations 18, oxygen saturation is 96% on room air.  SKIN:  Incisions are inspected, healing well without evidence of  infection.  I removed the chest tube sutures on today's date.  PULMONARY:  Clear breath sounds with the exception of diminished air  exchange in the left base.  CARDIAC:  Regular rate and rhythm.   ASSESSMENT:  Ms. Sandstrom is making adequate recovery following her  drainage and decortication of a hemothorax that developed spontaneously  due to a hypercoagulable state.  Currently, she is on aspirin only and  the Coumadin will be held and if required, will be  determined by Dr. Eldridge Dace as to future use.  We will see her again in 3  weeks for further monitoring.  This visit will include a chest x-ray.   Rowe Clack, P.A.-C.   [name]  D:  07/08/2009  T:  07/09/2009  Job:  [id number]   cc:   Corky Crafts, MD

## 2010-09-22 NOTE — Assessment & Plan Note (Signed)
OFFICE VISIT   Sophia [name]  DOB:  June 11, 1940                                        July 30, 2009  CHART #:  [id number]   HISTORY:  The patient is a 70 year old white female who is status post  left video-assisted thoracoscopic surgery and drainage of a hemothorax  with decortication following the development of this secondary to  supratherapeutic Coumadin.  She presented in February with an INR of 7.7  and a significant drop of her hemoglobin from 12.5-9.5.  She did well  postoperatively and is seen again in routine follow up.  Currently, she  reports that she is feeling well.  She worked full time last week with  only minimal discomforts.  She denies fevers, chills, or other  constitutional symptoms.  She does have some chronic cough but it  continues to be nonproductive.  She does have occasional mild shortness  of breath.   DIAGNOSTIC TESTS:  Chest x-ray was obtained on today's date.  It  continues to show some inflammatory and atelectatic changes and small  effusion in the left base.  It was quite stable in appearance.  There  were no additional new findings.   PHYSICAL EXAMINATION:  VITAL SIGNS:  Blood pressure 118/66, pulse is 64  and regular, respirations 18 and unlabored, oxygen saturation is 94% on  room air.  GENERAL APPEARANCE:  This is an well-developed adult white female in no  acute distress.  PULMONARY:  Slightly diminished breath sounds in the left base,  otherwise clear.  CARDIAC:  Regular rate and rhythm.  Incisions are inspected, healing  well without evidence of infection.   ASSESSMENT:  The patient continues to make good recovery.  We will see  her again in 2 months for final follow up with his planned office visit  to include a chest x-ray.   Sophia [name], [name].A.-C.   Sophia [name]  D:  07/30/2009  T:  07/31/2009  Job:  [id number]   cc:   Sophia Crafts, MD

## 2010-09-22 NOTE — Letter (Signed)
Oct 01, 2009   Dario Guardian, MD  [address]  [place], [place] [address]   Re:  [name], Sophia             DOB:  Mar 25, 1941   Dear Dr. Katrinka Blazing:   I saw the patient back today for final followup.  She is now almost 3  months since we did a thorascopic drainage of the left hemothorax with  decortication for development after being on Coumadin.  Her chest x-ray  today shows just minimal scarring on the left side.  Her incisions are  well healed.  She has had minimal pain.  She is doing well overall.  Her  blood pressure was 105/60, pulse 63, respirations 18, and sats were 97%.  I have released her back to your care and Dr. Hoyle Barr care and I will  be happy to see her again if she has any further thoracic problems.   Ines Bloomer, M.D.  Electronically Signed   DPB/MEDQ  D:  10/01/2009  T:  10/02/2009  Job:  [id number]   cc:   Corky Crafts, MD

## 2010-09-23 ENCOUNTER — Encounter: Payer: Medicare Other | Admitting: Occupational Therapy

## 2010-09-23 ENCOUNTER — Ambulatory Visit: Payer: Medicare Other | Admitting: Physical Therapy

## 2010-09-24 ENCOUNTER — Ambulatory Visit: Payer: Medicare Other | Admitting: Physical Therapy

## 2010-09-24 ENCOUNTER — Encounter: Payer: Medicare Other | Admitting: Occupational Therapy

## 2010-09-25 ENCOUNTER — Ambulatory Visit: Payer: Medicare Other | Admitting: Physical Therapy

## 2010-09-28 ENCOUNTER — Encounter: Payer: Medicare Other | Admitting: Occupational Therapy

## 2010-09-28 ENCOUNTER — Ambulatory Visit: Payer: Medicare Other | Admitting: Physical Therapy

## 2010-09-29 ENCOUNTER — Ambulatory Visit
Admission: RE | Admit: 2010-09-29 | Discharge: 2010-09-29 | Disposition: A | Discharge status: Home / Self Care | Payer: Medicare Other | Source: Ambulatory Visit | Attending: Neurosurgery | Admitting: Neurosurgery

## 2010-09-29 ENCOUNTER — Other Ambulatory Visit: Payer: PRIVATE HEALTH INSURANCE

## 2010-09-29 DIAGNOSIS — D332 Benign neoplasm of brain, unspecified: Secondary | ICD-10-CM

## 2010-09-29 MED ORDER — GADOBENATE DIMEGLUMINE 529 MG/ML IV SOLN
20.0000 mL | Freq: Once | INTRAVENOUS | Status: AC | PRN
  Administered 2010-09-29: 20 mL via INTRAVENOUS

## 2010-09-30 ENCOUNTER — Encounter: Payer: Medicare Other | Admitting: Physical Therapy

## 2010-09-30 ENCOUNTER — Encounter: Payer: Medicare Other | Admitting: Occupational Therapy

## 2010-10-01 ENCOUNTER — Encounter: Payer: Medicare Other | Admitting: Physical Therapy

## 2010-10-01 ENCOUNTER — Encounter: Payer: Medicare Other | Admitting: Occupational Therapy

## 2010-10-07 ENCOUNTER — Ambulatory Visit: Payer: Medicare Other | Admitting: Physical Therapy

## 2010-10-07 ENCOUNTER — Encounter: Payer: PRIVATE HEALTH INSURANCE | Admitting: Occupational Therapy

## 2010-10-08 ENCOUNTER — Encounter: Payer: PRIVATE HEALTH INSURANCE | Admitting: Occupational Therapy

## 2010-10-08 ENCOUNTER — Ambulatory Visit: Payer: PRIVATE HEALTH INSURANCE | Admitting: Physical Therapy

## 2010-10-12 ENCOUNTER — Ambulatory Visit: Payer: PRIVATE HEALTH INSURANCE | Admitting: Physical Therapy

## 2010-10-12 ENCOUNTER — Encounter: Payer: PRIVATE HEALTH INSURANCE | Admitting: Occupational Therapy

## 2010-10-14 ENCOUNTER — Encounter: Payer: PRIVATE HEALTH INSURANCE | Admitting: Occupational Therapy

## 2010-10-14 ENCOUNTER — Ambulatory Visit: Payer: PRIVATE HEALTH INSURANCE | Admitting: Physical Therapy

## 2010-10-19 ENCOUNTER — Encounter: Payer: PRIVATE HEALTH INSURANCE | Admitting: Occupational Therapy

## 2010-10-19 ENCOUNTER — Ambulatory Visit: Payer: PRIVATE HEALTH INSURANCE | Admitting: Physical Therapy

## 2010-10-21 ENCOUNTER — Encounter: Payer: PRIVATE HEALTH INSURANCE | Admitting: Occupational Therapy

## 2010-10-21 ENCOUNTER — Ambulatory Visit: Payer: PRIVATE HEALTH INSURANCE | Admitting: Physical Therapy

## 2011-03-23 IMAGING — CT CT CHEST W/ CM
2 of 3 series · 15 of 36 positions shown, 18 images · IV contrast (100 ML OMNI 300)
Comparison: Chest radiograph 05/13/2009.

CLINICAL DATA: Left pleural effusion.  Short of breath.

CT CHEST WITH CONTRAST
TECHNIQUE: Multidetector CT imaging of the chest was performed
following the standard protocol during bolus administration of
intravenous contrast.
Contrast: 100 mL [id number]-M33.

[Series 5: routine chest · axial · 0.70mm/px · z∈[-278,-18]mm · 12 of 62 slices shown, 15 images]
[im 5/62  mediastinal]
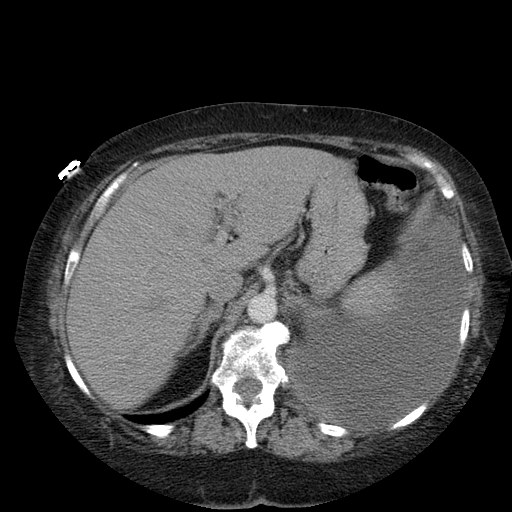
[im 5/62  lung]
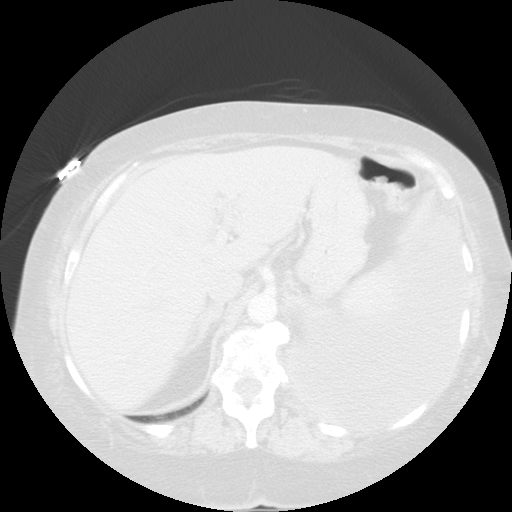
[im 10/62  lung]
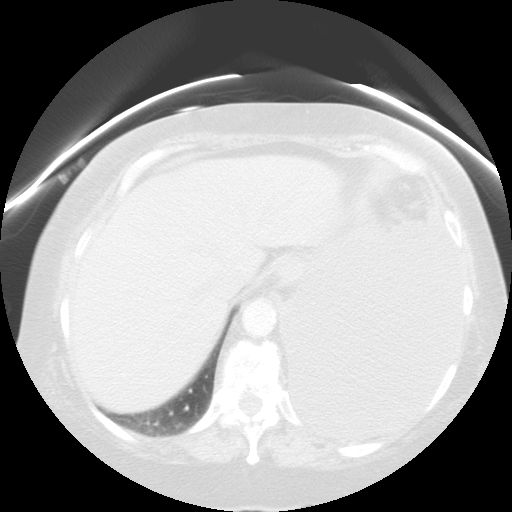
[im 14/62  lung]
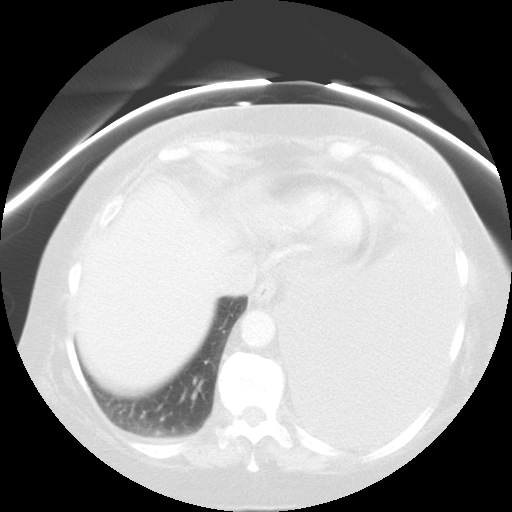
[im 19/62  lung]
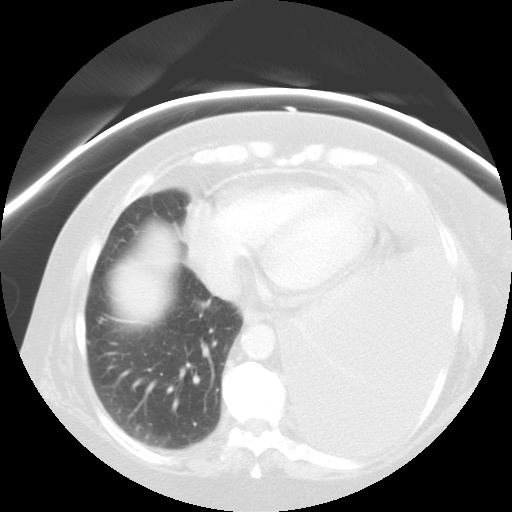
[im 23/62  mediastinal]
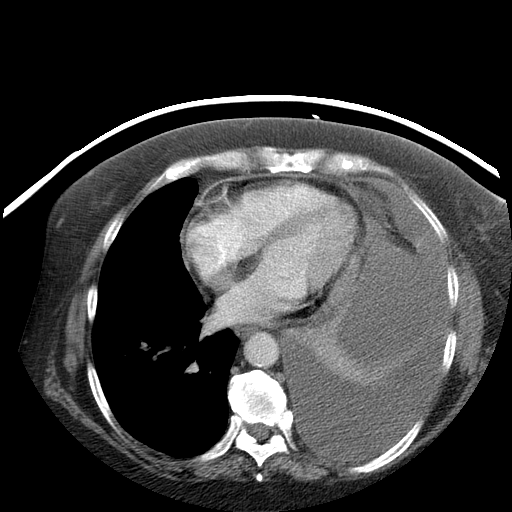
[im 23/62  lung]
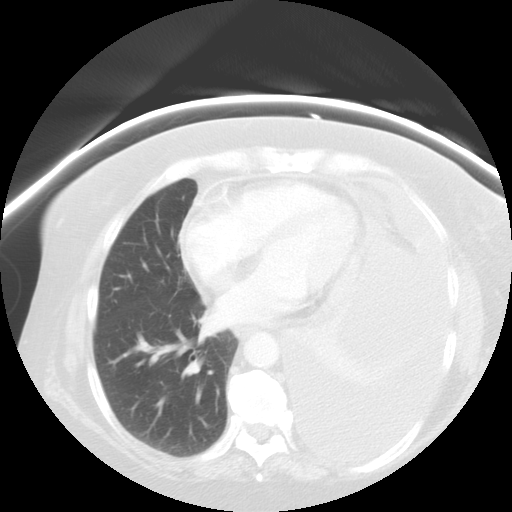
[im 28/62  lung]
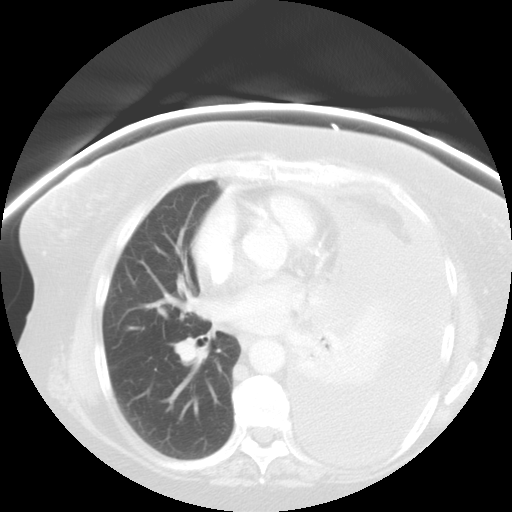
[im 34/62  lung]
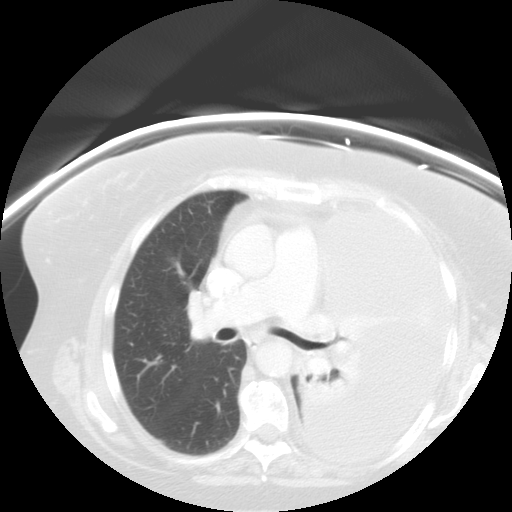
[im 39/62  lung]
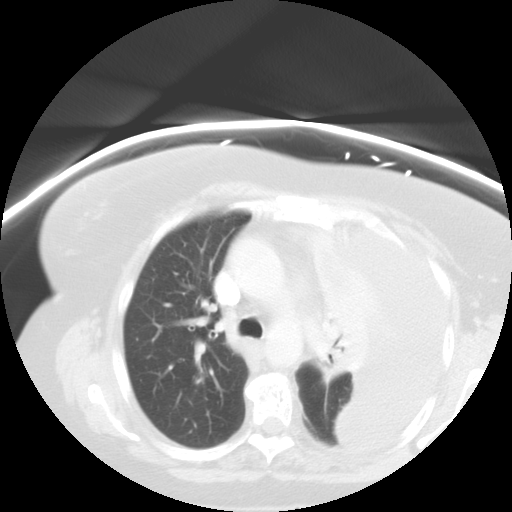
[im 43/62  mediastinal]
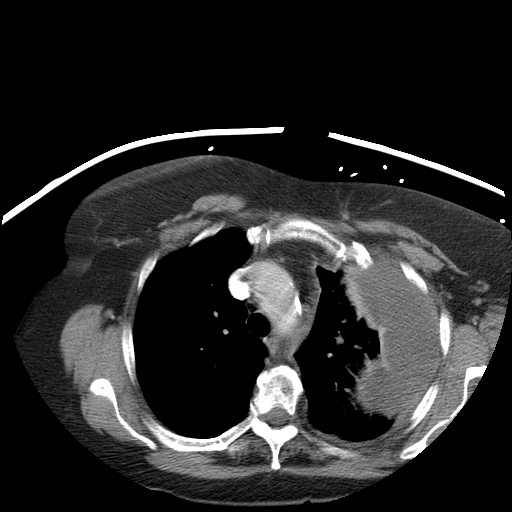
[im 43/62  lung]
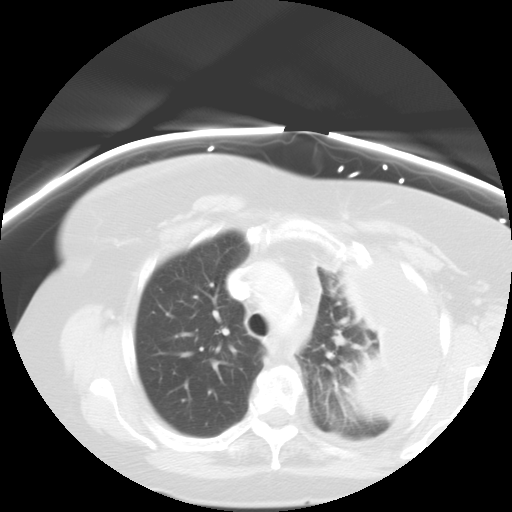
[im 48/62  lung]
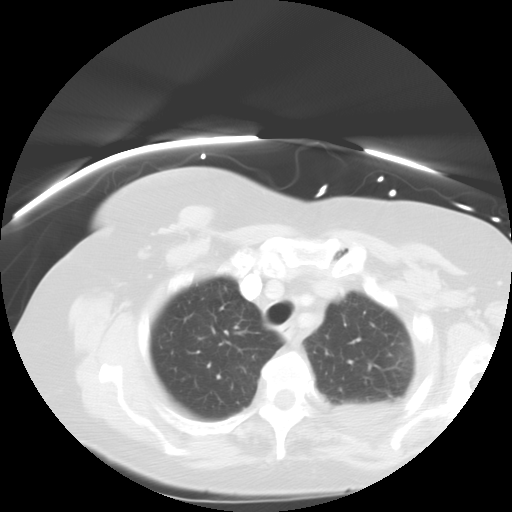
[im 52/62  lung]
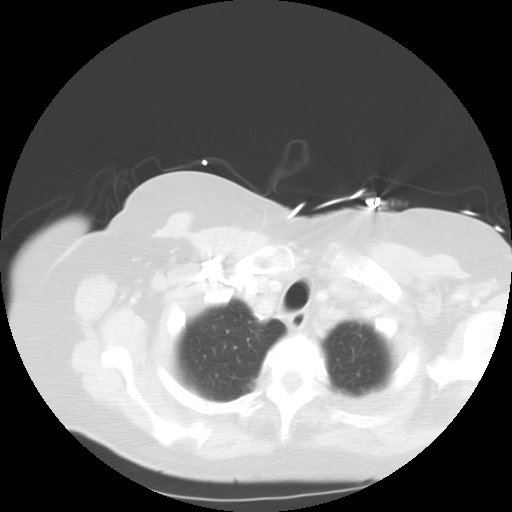
[im 57/62  lung]
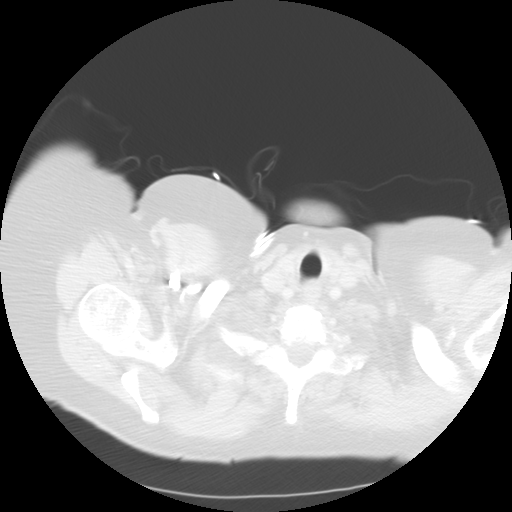

[Series 107: coronal · coronal · 0.88mm/px · 3 of 88 slices shown]
[im 18/88  lung]
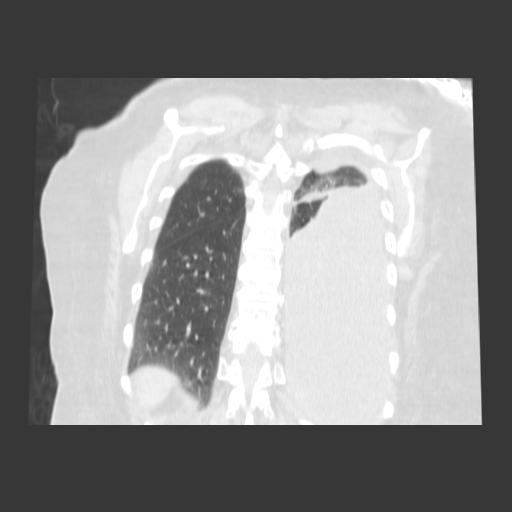
[im 35/88  lung]
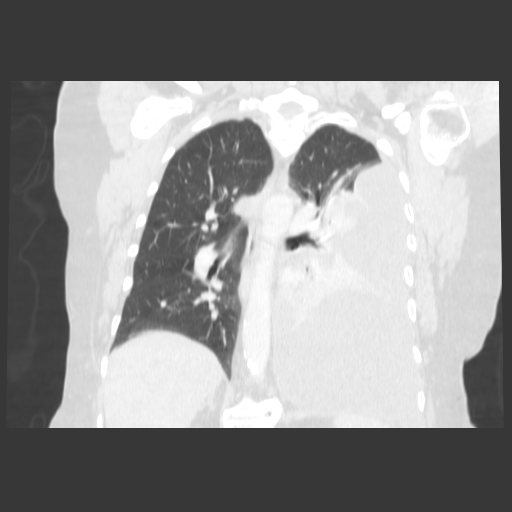
[im 53/88  lung]
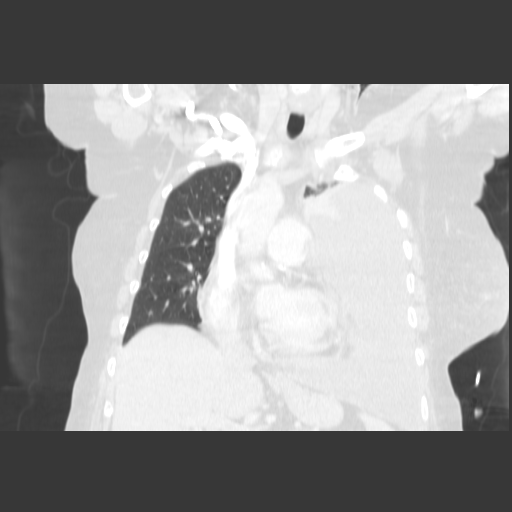

[15 of 36 positions shown; findings below may reference images not displayed]

FINDINGS: There is a large left pleural effusion with loculation
inferiorly and laterally.  Only a tiny posterior free flowing
effusion is present in the superior left hemithorax.  Compressive
atelectasis of the lobe lingula and left lower lobe.  Small amount
pericardial fluid or thickening.  Coronary artery atherosclerosis.
Aorta normal.  No axillary adenopathy.  No mediastinal adenopathy
identified.  Right lung demonstrates mild dependent atelectasis.
No airspace disease is identified.  Thoracic spondylosis large
hemangioma is present in the T6 vertebral body.  DISH of the
thoracic spine.
IMPRESSION: Large loculated left pleural effusion with only a small amount of
aerated left upper lobe.  Majority of effusion is loculated at the
left base.

## 2011-08-04 DIAGNOSIS — H109 Unspecified conjunctivitis: Secondary | ICD-10-CM | POA: Diagnosis not present

## 2011-08-04 DIAGNOSIS — J40 Bronchitis, not specified as acute or chronic: Secondary | ICD-10-CM | POA: Diagnosis not present

## 2011-09-27 DIAGNOSIS — I10 Essential (primary) hypertension: Secondary | ICD-10-CM | POA: Diagnosis not present

## 2011-09-27 DIAGNOSIS — E7800 Pure hypercholesterolemia, unspecified: Secondary | ICD-10-CM | POA: Diagnosis not present

## 2011-09-27 DIAGNOSIS — E039 Hypothyroidism, unspecified: Secondary | ICD-10-CM | POA: Diagnosis not present

## 2011-09-27 DIAGNOSIS — L989 Disorder of the skin and subcutaneous tissue, unspecified: Secondary | ICD-10-CM | POA: Diagnosis not present

## 2011-11-08 ENCOUNTER — Other Ambulatory Visit: Payer: Self-pay | Admitting: Family Medicine

## 2011-11-08 DIAGNOSIS — L821 Other seborrheic keratosis: Secondary | ICD-10-CM | POA: Diagnosis not present

## 2011-11-15 DIAGNOSIS — H2510 Age-related nuclear cataract, unspecified eye: Secondary | ICD-10-CM | POA: Diagnosis not present

## 2011-11-25 DIAGNOSIS — IMO0002 Reserved for concepts with insufficient information to code with codable children: Secondary | ICD-10-CM | POA: Diagnosis not present

## 2011-11-25 DIAGNOSIS — H2510 Age-related nuclear cataract, unspecified eye: Secondary | ICD-10-CM | POA: Diagnosis not present

## 2011-12-16 DIAGNOSIS — IMO0002 Reserved for concepts with insufficient information to code with codable children: Secondary | ICD-10-CM | POA: Diagnosis not present

## 2011-12-16 DIAGNOSIS — H2510 Age-related nuclear cataract, unspecified eye: Secondary | ICD-10-CM | POA: Diagnosis not present

## 2012-01-12 DIAGNOSIS — Z23 Encounter for immunization: Secondary | ICD-10-CM | POA: Diagnosis not present

## 2012-01-25 DIAGNOSIS — I4891 Unspecified atrial fibrillation: Secondary | ICD-10-CM | POA: Diagnosis not present

## 2012-01-25 DIAGNOSIS — I10 Essential (primary) hypertension: Secondary | ICD-10-CM | POA: Diagnosis not present

## 2012-01-25 DIAGNOSIS — E669 Obesity, unspecified: Secondary | ICD-10-CM | POA: Diagnosis not present

## 2012-09-21 DIAGNOSIS — E039 Hypothyroidism, unspecified: Secondary | ICD-10-CM | POA: Diagnosis not present

## 2012-09-21 DIAGNOSIS — E7800 Pure hypercholesterolemia, unspecified: Secondary | ICD-10-CM | POA: Diagnosis not present

## 2012-09-21 DIAGNOSIS — I10 Essential (primary) hypertension: Secondary | ICD-10-CM | POA: Diagnosis not present

## 2012-09-21 DIAGNOSIS — Z23 Encounter for immunization: Secondary | ICD-10-CM | POA: Diagnosis not present

## 2013-01-19 DIAGNOSIS — Z23 Encounter for immunization: Secondary | ICD-10-CM | POA: Diagnosis not present

## 2013-01-24 DIAGNOSIS — Z961 Presence of intraocular lens: Secondary | ICD-10-CM | POA: Diagnosis not present

## 2013-01-24 DIAGNOSIS — H179 Unspecified corneal scar and opacity: Secondary | ICD-10-CM | POA: Diagnosis not present

## 2013-02-26 ENCOUNTER — Ambulatory Visit: Payer: Medicare Other | Admitting: Interventional Cardiology

## 2013-03-01 ENCOUNTER — Encounter: Payer: Self-pay | Admitting: Interventional Cardiology

## 2013-03-01 ENCOUNTER — Encounter: Payer: Self-pay | Admitting: *Deleted

## 2013-03-02 ENCOUNTER — Encounter: Payer: Self-pay | Admitting: Interventional Cardiology

## 2013-03-02 ENCOUNTER — Ambulatory Visit (INDEPENDENT_AMBULATORY_CARE_PROVIDER_SITE_OTHER): Payer: Medicare Other | Admitting: Interventional Cardiology

## 2013-03-02 ENCOUNTER — Encounter (INDEPENDENT_AMBULATORY_CARE_PROVIDER_SITE_OTHER): Payer: Self-pay

## 2013-03-02 VITALS — BP 136/76 | HR 65 | Ht 66.0 in | Wt 244.0 lb

## 2013-03-02 DIAGNOSIS — E669 Obesity, unspecified: Secondary | ICD-10-CM | POA: Diagnosis not present

## 2013-03-02 DIAGNOSIS — Z0389 Encounter for observation for other suspected diseases and conditions ruled out: Secondary | ICD-10-CM | POA: Diagnosis not present

## 2013-03-02 DIAGNOSIS — I10 Essential (primary) hypertension: Secondary | ICD-10-CM | POA: Diagnosis not present

## 2013-03-02 DIAGNOSIS — I4891 Unspecified atrial fibrillation: Secondary | ICD-10-CM | POA: Diagnosis not present

## 2013-03-02 NOTE — Progress Notes (Signed)
Patient ID: Sophia Woods, female   DOB: 1941-03-30, 72 y.o.   MRN: [id number]    [address] [place], [place]  [address] Phone: [phone] Fax:  [phone]  Date:  03/02/2013   ID:  Sophia Woods 13-Dec-1940, MRN [id number]  PCP:  Allean Found, MD      History of Present Illness: Sophia Woods is a 72 y.o. female who had AFib and a hemothorax on coumadin. Treated with a chest tube. COumadin was stopped. She then had a hemangioma in her brain that bled. Given the bleeding problems with aspirin, aspirin was stopped.  It was restarted and there have been no bleeding problems.  Left knee swollen area.   Atrial Fibrillation F/U:  walks for exercise, 30 minutes daily. c/o Leg edema in feet.  Denies : Chest pain.  Dizziness.  Orthopnea.  Palpitations.  Shortness of breath.  Syncope.     Wt Readings from Last 3 Encounters:  03/02/13 244 lb (110.678 kg)     Past Medical History  Diagnosis Date  . Hypercholesteremia   . Atrial fibrillation   . Hypothyroidism   . Obesity     Current Outpatient Prescriptions  Medication Sig Dispense Refill  . Calcium Carbonate-Vitamin D (CALCIUM 500+D PO) Take 500 mg by mouth daily. Take one tab      . aspirin 81 MG tablet Take 81 mg by mouth daily.      Marland Kitchen atorvastatin (LIPITOR) 40 MG tablet Take 20 mg by mouth daily.      Marland Kitchen diltiazem (TIAZAC) 120 MG 24 hr capsule Take 120 mg by mouth daily.      . furosemide (LASIX) 40 MG tablet Take 40 mg by mouth daily. Take 1/2 tab      . levothyroxine (SYNTHROID, LEVOTHROID) 50 MCG tablet Take 50 mcg by mouth daily before breakfast.      . losartan (COZAAR) 50 MG tablet Take 50 mg by mouth daily. One tab      . metoprolol tartrate (LOPRESSOR) 25 MG tablet Take 25 mg by mouth 2 (two) times daily.       No current facility-administered medications for this visit.    Allergies:    Allergies  Allergen Reactions  . Penicillins     Hives    Social History:  The patient  reports  that she quit smoking about 24 years ago. She does not have any smokeless tobacco history on file.   Family History:  The patient's family history includes Cancer - Prostate in her father; Hypertension in her mother.   ROS:  Please see the history of present illness.  No nausea, vomiting.  No fevers, chills.  No focal weakness.  No dysuria. Weight gain.  All other systems reviewed and negative.   PHYSICAL EXAM: VS:  BP 136/76  Pulse 65  Ht 5\' 6"  (1.676 m)  Wt 244 lb (110.678 kg)  BMI 39.4 kg/m2 Well nourished, well developed, in no acute distress HEENT: normal Neck: no JVD, no carotid bruits Cardiac:  normal S1, S2; RRR;  Lungs:  clear to auscultation bilaterally, no wheezing, rhonchi or rales Abd: soft, nontender, no hepatomegaly Ext: no edema Skin: warm and dry Neuro:   no focal abnormalities noted  EKG:  Normal  ASSESSMENT AND PLAN:  Atrial fibrillation  Continue Aspirin EC Lo-Dose Tablet Delayed Release, 81 MG, 1 tablet, Orally, Once a day, 30 day(s), 30 Diagnostic Imaging:EKG Harward,Amy 01/25/2012 01:55:17 PM > VARANASI,JAY 01/25/2012 02:19:03 PM >  NSR, no ST segment changes  Maintaing NSR. Not a coumadin candidate given hemothorax.  Has been tolerating baby aspirin.    2. Essential hypertension, benign  Restart Taztia XT Capsule Extended Release 24 Hour, 120 MG, 1 capsule, Orally, Once a day, 90, 90, Refills 3 Continue Metoprolol Tartrate Tablet, 25 MG, 1 tablet, Orally, Twice a day, 90 day(s), 180, Refills 3 Continue Losartan Potassium Tablet, 50 MG, 1 tablet, Orally, Once a day Well controlled. COntinue current meds.    3. Obesity, unspecified  Continue to try to lose weight. She is exercising regularly. She will watch her diet more carefully. she knows her diet has been off recently which has caused weight gain.   LDL107 in 5/14.   Preventive Medicine  Adult topics discussed:  Diet: healthy diet, low calorie, low fat.   Increase exercise, at least 5 days a week,  30 minutes a day  Signed, Fredric Mare, MD, [facility] 03/02/2013 4:36 PM

## 2013-03-02 NOTE — Patient Instructions (Signed)
Your physician wants you to follow-up in: 1 year with Dr. Varanasi. You will receive a reminder letter in the mail two months in advance. If you don't receive a letter, please call our office to schedule the follow-up appointment.  Your physician recommends that you continue on your current medications as directed. Please refer to the Current Medication list given to you today.  

## 2013-06-15 ENCOUNTER — Other Ambulatory Visit: Payer: Self-pay

## 2013-06-15 MED ORDER — DILTIAZEM HCL ER BEADS 120 MG PO CP24: 120.0000 mg | ORAL_CAPSULE | Freq: Every day | ORAL | Status: DC

## 2014-01-02 DIAGNOSIS — M79609 Pain in unspecified limb: Secondary | ICD-10-CM | POA: Diagnosis not present

## 2014-01-02 DIAGNOSIS — J069 Acute upper respiratory infection, unspecified: Secondary | ICD-10-CM | POA: Diagnosis not present

## 2014-01-02 DIAGNOSIS — E039 Hypothyroidism, unspecified: Secondary | ICD-10-CM | POA: Diagnosis not present

## 2014-01-02 DIAGNOSIS — E7800 Pure hypercholesterolemia, unspecified: Secondary | ICD-10-CM | POA: Diagnosis not present

## 2014-01-02 DIAGNOSIS — I10 Essential (primary) hypertension: Secondary | ICD-10-CM | POA: Diagnosis not present

## 2014-01-02 DIAGNOSIS — I4891 Unspecified atrial fibrillation: Secondary | ICD-10-CM | POA: Diagnosis not present

## 2014-01-03 DIAGNOSIS — I10 Essential (primary) hypertension: Secondary | ICD-10-CM | POA: Diagnosis not present

## 2014-01-03 DIAGNOSIS — E7800 Pure hypercholesterolemia, unspecified: Secondary | ICD-10-CM | POA: Diagnosis not present

## 2014-02-06 DIAGNOSIS — Z23 Encounter for immunization: Secondary | ICD-10-CM | POA: Diagnosis not present

## 2014-08-12 DIAGNOSIS — Z961 Presence of intraocular lens: Secondary | ICD-10-CM | POA: Diagnosis not present

## 2014-08-12 DIAGNOSIS — H524 Presbyopia: Secondary | ICD-10-CM | POA: Diagnosis not present

## 2014-08-13 DIAGNOSIS — L309 Dermatitis, unspecified: Secondary | ICD-10-CM | POA: Diagnosis not present

## 2014-09-05 ENCOUNTER — Other Ambulatory Visit: Payer: Self-pay | Admitting: Dermatology

## 2014-09-05 DIAGNOSIS — D485 Neoplasm of uncertain behavior of skin: Secondary | ICD-10-CM | POA: Diagnosis not present

## 2014-09-05 DIAGNOSIS — M329 Systemic lupus erythematosus, unspecified: Secondary | ICD-10-CM | POA: Diagnosis not present

## 2014-09-05 DIAGNOSIS — L309 Dermatitis, unspecified: Secondary | ICD-10-CM | POA: Diagnosis not present

## 2014-09-17 DIAGNOSIS — M320 Drug-induced systemic lupus erythematosus: Secondary | ICD-10-CM | POA: Diagnosis not present

## 2014-09-17 DIAGNOSIS — M329 Systemic lupus erythematosus, unspecified: Secondary | ICD-10-CM | POA: Diagnosis not present

## 2014-10-23 DIAGNOSIS — E780 Pure hypercholesterolemia: Secondary | ICD-10-CM | POA: Diagnosis not present

## 2014-10-23 DIAGNOSIS — Z23 Encounter for immunization: Secondary | ICD-10-CM | POA: Diagnosis not present

## 2014-10-23 DIAGNOSIS — M79606 Pain in leg, unspecified: Secondary | ICD-10-CM | POA: Diagnosis not present

## 2014-10-23 DIAGNOSIS — I10 Essential (primary) hypertension: Secondary | ICD-10-CM | POA: Diagnosis not present

## 2014-10-23 DIAGNOSIS — E039 Hypothyroidism, unspecified: Secondary | ICD-10-CM | POA: Diagnosis not present

## 2015-02-08 DIAGNOSIS — Z23 Encounter for immunization: Secondary | ICD-10-CM | POA: Diagnosis not present

## 2015-07-30 ENCOUNTER — Other Ambulatory Visit: Payer: Self-pay | Admitting: Gastroenterology

## 2016-02-10 ENCOUNTER — Encounter: Payer: PRIVATE HEALTH INSURANCE | Admitting: Oncology

## 2019-05-31 ENCOUNTER — Ambulatory Visit: Payer: Medicare Other | Attending: Internal Medicine

## 2019-05-31 DIAGNOSIS — Z23 Encounter for immunization: Secondary | ICD-10-CM | POA: Insufficient documentation

## 2019-05-31 NOTE — Progress Notes (Signed)
   Covid-19 Vaccination Clinic  Name:  Sophia Woods    MRN: [id number] DOB: [birth year]  05/31/2019  Sophia Woods was observed post Covid-19 immunization for 15 minutes without incidence. She was provided with Vaccine Information Sheet and instruction to access the V-Safe system.   Sophia Woods was instructed to call 911 with any severe reactions post vaccine: Marland Kitchen Difficulty breathing  . Swelling of your face and throat  . A fast heartbeat  . A bad rash all over your body  . Dizziness and weakness    Immunizations Administered    Name Date Dose VIS Date Route   Pfizer COVID-19 Vaccine 05/31/2019 10:24 AM 0.3 mL 04/20/2019 Intramuscular   Manufacturer: [place]   Lot: [id number]   [place]: [id number]

## 2019-06-21 ENCOUNTER — Ambulatory Visit: Payer: Medicare Other | Attending: Internal Medicine

## 2019-06-21 DIAGNOSIS — Z23 Encounter for immunization: Secondary | ICD-10-CM | POA: Insufficient documentation

## 2019-06-21 NOTE — Progress Notes (Signed)
   Covid-19 Vaccination Clinic  Name:  Sophia Woods    MRN: [id number] DOB: 18-May-1940  06/21/2019  [name] was observed post Covid-19 immunization for 15 minutes without incidence. She was provided with Vaccine Information Sheet and instruction to access the V-Safe system.   [name] was instructed to call 911 with any severe reactions post vaccine: Marland Kitchen Difficulty breathing  . Swelling of 2your face2 and throat  . A fast heartbeat  . A bad rash all over your body  . Dizziness and weakness    Immunizations Administered    Name Date Dose VIS Date Route   Pfizer COVID-19 Vaccine 06/21/2019 10:50 AM 0.3 mL 04/20/2019 Intramuscular   Manufacturer: [employer]   Lot: [id number]   [place]: [id number]

## 2020-02-22 ENCOUNTER — Other Ambulatory Visit: Payer: Self-pay | Admitting: Family Medicine

## 2020-02-22 DIAGNOSIS — I809 Phlebitis and thrombophlebitis of unspecified site: Secondary | ICD-10-CM

## 2020-02-28 ENCOUNTER — Ambulatory Visit
Admission: RE | Admit: 2020-02-28 | Discharge: 2020-02-28 | Disposition: A | Discharge status: Home / Self Care | Payer: Medicare Other | Source: Ambulatory Visit | Attending: Family Medicine | Admitting: Family Medicine

## 2020-02-28 DIAGNOSIS — I809 Phlebitis and thrombophlebitis of unspecified site: Secondary | ICD-10-CM

## 2020-05-28 DIAGNOSIS — I480 Paroxysmal atrial fibrillation: Secondary | ICD-10-CM | POA: Diagnosis not present

## 2020-05-28 DIAGNOSIS — I10 Essential (primary) hypertension: Secondary | ICD-10-CM | POA: Diagnosis not present

## 2020-05-28 DIAGNOSIS — E785 Hyperlipidemia, unspecified: Secondary | ICD-10-CM | POA: Diagnosis not present

## 2020-05-28 DIAGNOSIS — E039 Hypothyroidism, unspecified: Secondary | ICD-10-CM | POA: Diagnosis not present

## 2020-05-28 DIAGNOSIS — E7800 Pure hypercholesterolemia, unspecified: Secondary | ICD-10-CM | POA: Diagnosis not present

## 2020-05-28 DIAGNOSIS — I611 Nontraumatic intracerebral hemorrhage in hemisphere, cortical: Secondary | ICD-10-CM | POA: Diagnosis not present

## 2020-06-26 DIAGNOSIS — I10 Essential (primary) hypertension: Secondary | ICD-10-CM | POA: Diagnosis not present

## 2020-06-26 DIAGNOSIS — E785 Hyperlipidemia, unspecified: Secondary | ICD-10-CM | POA: Diagnosis not present

## 2020-06-26 DIAGNOSIS — E7800 Pure hypercholesterolemia, unspecified: Secondary | ICD-10-CM | POA: Diagnosis not present

## 2020-06-26 DIAGNOSIS — E039 Hypothyroidism, unspecified: Secondary | ICD-10-CM | POA: Diagnosis not present

## 2020-06-26 DIAGNOSIS — I480 Paroxysmal atrial fibrillation: Secondary | ICD-10-CM | POA: Diagnosis not present

## 2020-06-26 DIAGNOSIS — I611 Nontraumatic intracerebral hemorrhage in hemisphere, cortical: Secondary | ICD-10-CM | POA: Diagnosis not present

## 2020-08-21 DIAGNOSIS — I611 Nontraumatic intracerebral hemorrhage in hemisphere, cortical: Secondary | ICD-10-CM | POA: Diagnosis not present

## 2020-08-21 DIAGNOSIS — E785 Hyperlipidemia, unspecified: Secondary | ICD-10-CM | POA: Diagnosis not present

## 2020-08-21 DIAGNOSIS — E039 Hypothyroidism, unspecified: Secondary | ICD-10-CM | POA: Diagnosis not present

## 2020-08-21 DIAGNOSIS — E7800 Pure hypercholesterolemia, unspecified: Secondary | ICD-10-CM | POA: Diagnosis not present

## 2020-08-21 DIAGNOSIS — I10 Essential (primary) hypertension: Secondary | ICD-10-CM | POA: Diagnosis not present

## 2020-08-21 DIAGNOSIS — I480 Paroxysmal atrial fibrillation: Secondary | ICD-10-CM | POA: Diagnosis not present

## 2020-09-15 DIAGNOSIS — I10 Essential (primary) hypertension: Secondary | ICD-10-CM | POA: Diagnosis not present

## 2020-09-15 DIAGNOSIS — E039 Hypothyroidism, unspecified: Secondary | ICD-10-CM | POA: Diagnosis not present

## 2020-09-15 DIAGNOSIS — I480 Paroxysmal atrial fibrillation: Secondary | ICD-10-CM | POA: Diagnosis not present

## 2020-09-15 DIAGNOSIS — E785 Hyperlipidemia, unspecified: Secondary | ICD-10-CM | POA: Diagnosis not present

## 2020-09-15 DIAGNOSIS — Z0000 Encounter for general adult medical examination without abnormal findings: Secondary | ICD-10-CM | POA: Diagnosis not present

## 2020-09-15 DIAGNOSIS — Z1389 Encounter for screening for other disorder: Secondary | ICD-10-CM | POA: Diagnosis not present

## 2020-09-23 DIAGNOSIS — I10 Essential (primary) hypertension: Secondary | ICD-10-CM | POA: Diagnosis not present

## 2020-09-23 DIAGNOSIS — I480 Paroxysmal atrial fibrillation: Secondary | ICD-10-CM | POA: Diagnosis not present

## 2020-09-23 DIAGNOSIS — E785 Hyperlipidemia, unspecified: Secondary | ICD-10-CM | POA: Diagnosis not present

## 2020-09-23 DIAGNOSIS — E7800 Pure hypercholesterolemia, unspecified: Secondary | ICD-10-CM | POA: Diagnosis not present

## 2020-09-23 DIAGNOSIS — E039 Hypothyroidism, unspecified: Secondary | ICD-10-CM | POA: Diagnosis not present

## 2020-09-23 DIAGNOSIS — I611 Nontraumatic intracerebral hemorrhage in hemisphere, cortical: Secondary | ICD-10-CM | POA: Diagnosis not present

## 2020-10-15 DIAGNOSIS — D649 Anemia, unspecified: Secondary | ICD-10-CM | POA: Diagnosis not present

## 2020-11-24 DIAGNOSIS — I10 Essential (primary) hypertension: Secondary | ICD-10-CM | POA: Diagnosis not present

## 2020-11-24 DIAGNOSIS — E7800 Pure hypercholesterolemia, unspecified: Secondary | ICD-10-CM | POA: Diagnosis not present

## 2020-11-24 DIAGNOSIS — E785 Hyperlipidemia, unspecified: Secondary | ICD-10-CM | POA: Diagnosis not present

## 2020-11-24 DIAGNOSIS — I611 Nontraumatic intracerebral hemorrhage in hemisphere, cortical: Secondary | ICD-10-CM | POA: Diagnosis not present

## 2020-11-24 DIAGNOSIS — E039 Hypothyroidism, unspecified: Secondary | ICD-10-CM | POA: Diagnosis not present

## 2020-11-24 DIAGNOSIS — I480 Paroxysmal atrial fibrillation: Secondary | ICD-10-CM | POA: Diagnosis not present

## 2020-12-13 DIAGNOSIS — B029 Zoster without complications: Secondary | ICD-10-CM | POA: Diagnosis not present

## 2020-12-13 DIAGNOSIS — R21 Rash and other nonspecific skin eruption: Secondary | ICD-10-CM | POA: Diagnosis not present

## 2021-01-26 DIAGNOSIS — I10 Essential (primary) hypertension: Secondary | ICD-10-CM | POA: Diagnosis not present

## 2021-01-26 DIAGNOSIS — E039 Hypothyroidism, unspecified: Secondary | ICD-10-CM | POA: Diagnosis not present

## 2021-01-26 DIAGNOSIS — I480 Paroxysmal atrial fibrillation: Secondary | ICD-10-CM | POA: Diagnosis not present

## 2021-01-26 DIAGNOSIS — E7800 Pure hypercholesterolemia, unspecified: Secondary | ICD-10-CM | POA: Diagnosis not present

## 2021-01-26 DIAGNOSIS — E785 Hyperlipidemia, unspecified: Secondary | ICD-10-CM | POA: Diagnosis not present

## 2021-02-09 DIAGNOSIS — H5213 Myopia, bilateral: Secondary | ICD-10-CM | POA: Diagnosis not present

## 2021-02-09 DIAGNOSIS — Z961 Presence of intraocular lens: Secondary | ICD-10-CM | POA: Diagnosis not present

## 2021-03-05 DIAGNOSIS — I611 Nontraumatic intracerebral hemorrhage in hemisphere, cortical: Secondary | ICD-10-CM | POA: Diagnosis not present

## 2021-03-05 DIAGNOSIS — I10 Essential (primary) hypertension: Secondary | ICD-10-CM | POA: Diagnosis not present

## 2021-03-05 DIAGNOSIS — E785 Hyperlipidemia, unspecified: Secondary | ICD-10-CM | POA: Diagnosis not present

## 2021-03-05 DIAGNOSIS — I480 Paroxysmal atrial fibrillation: Secondary | ICD-10-CM | POA: Diagnosis not present

## 2021-03-05 DIAGNOSIS — E7800 Pure hypercholesterolemia, unspecified: Secondary | ICD-10-CM | POA: Diagnosis not present

## 2021-03-05 DIAGNOSIS — E039 Hypothyroidism, unspecified: Secondary | ICD-10-CM | POA: Diagnosis not present

## 2021-03-19 DIAGNOSIS — I480 Paroxysmal atrial fibrillation: Secondary | ICD-10-CM | POA: Diagnosis not present

## 2021-03-19 DIAGNOSIS — K529 Noninfective gastroenteritis and colitis, unspecified: Secondary | ICD-10-CM | POA: Diagnosis not present

## 2021-03-19 DIAGNOSIS — R946 Abnormal results of thyroid function studies: Secondary | ICD-10-CM | POA: Diagnosis not present

## 2021-03-19 DIAGNOSIS — E039 Hypothyroidism, unspecified: Secondary | ICD-10-CM | POA: Diagnosis not present

## 2021-03-19 DIAGNOSIS — E785 Hyperlipidemia, unspecified: Secondary | ICD-10-CM | POA: Diagnosis not present

## 2021-03-19 DIAGNOSIS — R609 Edema, unspecified: Secondary | ICD-10-CM | POA: Diagnosis not present

## 2021-03-19 DIAGNOSIS — M129 Arthropathy, unspecified: Secondary | ICD-10-CM | POA: Diagnosis not present

## 2021-03-19 DIAGNOSIS — I10 Essential (primary) hypertension: Secondary | ICD-10-CM | POA: Diagnosis not present

## 2021-03-19 DIAGNOSIS — R634 Abnormal weight loss: Secondary | ICD-10-CM | POA: Diagnosis not present

## 2021-04-10 DIAGNOSIS — R197 Diarrhea, unspecified: Secondary | ICD-10-CM | POA: Diagnosis not present

## 2021-04-13 DIAGNOSIS — R197 Diarrhea, unspecified: Secondary | ICD-10-CM | POA: Diagnosis not present

## 2021-04-17 DIAGNOSIS — Z86010 Personal history of colonic polyps: Secondary | ICD-10-CM | POA: Diagnosis not present

## 2021-04-17 DIAGNOSIS — K5730 Diverticulosis of large intestine without perforation or abscess without bleeding: Secondary | ICD-10-CM | POA: Diagnosis not present

## 2021-04-17 DIAGNOSIS — K648 Other hemorrhoids: Secondary | ICD-10-CM | POA: Diagnosis not present

## 2021-04-17 DIAGNOSIS — D125 Benign neoplasm of sigmoid colon: Secondary | ICD-10-CM | POA: Diagnosis not present

## 2021-04-17 DIAGNOSIS — R197 Diarrhea, unspecified: Secondary | ICD-10-CM | POA: Diagnosis not present

## 2021-04-21 DIAGNOSIS — D125 Benign neoplasm of sigmoid colon: Secondary | ICD-10-CM | POA: Diagnosis not present

## 2021-04-21 DIAGNOSIS — R197 Diarrhea, unspecified: Secondary | ICD-10-CM | POA: Diagnosis not present

## 2021-05-05 DIAGNOSIS — I10 Essential (primary) hypertension: Secondary | ICD-10-CM | POA: Diagnosis not present

## 2021-05-29 DIAGNOSIS — E785 Hyperlipidemia, unspecified: Secondary | ICD-10-CM | POA: Diagnosis not present

## 2021-05-29 DIAGNOSIS — R944 Abnormal results of kidney function studies: Secondary | ICD-10-CM | POA: Diagnosis not present

## 2021-05-29 DIAGNOSIS — I10 Essential (primary) hypertension: Secondary | ICD-10-CM | POA: Diagnosis not present

## 2021-05-29 DIAGNOSIS — E7800 Pure hypercholesterolemia, unspecified: Secondary | ICD-10-CM | POA: Diagnosis not present

## 2021-05-29 DIAGNOSIS — E039 Hypothyroidism, unspecified: Secondary | ICD-10-CM | POA: Diagnosis not present

## 2021-05-29 DIAGNOSIS — I480 Paroxysmal atrial fibrillation: Secondary | ICD-10-CM | POA: Diagnosis not present

## 2021-06-08 DIAGNOSIS — E039 Hypothyroidism, unspecified: Secondary | ICD-10-CM | POA: Diagnosis not present

## 2021-06-08 DIAGNOSIS — I10 Essential (primary) hypertension: Secondary | ICD-10-CM | POA: Diagnosis not present

## 2021-06-08 DIAGNOSIS — I480 Paroxysmal atrial fibrillation: Secondary | ICD-10-CM | POA: Diagnosis not present

## 2021-06-08 DIAGNOSIS — R197 Diarrhea, unspecified: Secondary | ICD-10-CM | POA: Diagnosis not present

## 2021-06-08 DIAGNOSIS — R634 Abnormal weight loss: Secondary | ICD-10-CM | POA: Diagnosis not present

## 2021-06-08 DIAGNOSIS — E440 Moderate protein-calorie malnutrition: Secondary | ICD-10-CM | POA: Diagnosis not present

## 2021-06-30 DIAGNOSIS — R944 Abnormal results of kidney function studies: Secondary | ICD-10-CM | POA: Diagnosis not present

## 2021-06-30 DIAGNOSIS — R609 Edema, unspecified: Secondary | ICD-10-CM | POA: Diagnosis not present

## 2021-06-30 DIAGNOSIS — I10 Essential (primary) hypertension: Secondary | ICD-10-CM | POA: Diagnosis not present

## 2021-06-30 DIAGNOSIS — M129 Arthropathy, unspecified: Secondary | ICD-10-CM | POA: Diagnosis not present

## 2021-06-30 DIAGNOSIS — E039 Hypothyroidism, unspecified: Secondary | ICD-10-CM | POA: Diagnosis not present

## 2021-06-30 DIAGNOSIS — I480 Paroxysmal atrial fibrillation: Secondary | ICD-10-CM | POA: Diagnosis not present

## 2021-06-30 DIAGNOSIS — E785 Hyperlipidemia, unspecified: Secondary | ICD-10-CM | POA: Diagnosis not present

## 2021-07-24 DIAGNOSIS — R197 Diarrhea, unspecified: Secondary | ICD-10-CM | POA: Diagnosis not present

## 2021-10-06 DIAGNOSIS — E785 Hyperlipidemia, unspecified: Secondary | ICD-10-CM | POA: Diagnosis not present

## 2021-10-06 DIAGNOSIS — R944 Abnormal results of kidney function studies: Secondary | ICD-10-CM | POA: Diagnosis not present

## 2021-10-06 DIAGNOSIS — E039 Hypothyroidism, unspecified: Secondary | ICD-10-CM | POA: Diagnosis not present

## 2021-10-06 DIAGNOSIS — Z0000 Encounter for general adult medical examination without abnormal findings: Secondary | ICD-10-CM | POA: Diagnosis not present

## 2021-10-06 DIAGNOSIS — I10 Essential (primary) hypertension: Secondary | ICD-10-CM | POA: Diagnosis not present

## 2021-10-06 DIAGNOSIS — I480 Paroxysmal atrial fibrillation: Secondary | ICD-10-CM | POA: Diagnosis not present

## 2021-10-06 DIAGNOSIS — R609 Edema, unspecified: Secondary | ICD-10-CM | POA: Diagnosis not present

## 2021-11-11 DIAGNOSIS — R946 Abnormal results of thyroid function studies: Secondary | ICD-10-CM | POA: Diagnosis not present

## 2021-11-11 DIAGNOSIS — D649 Anemia, unspecified: Secondary | ICD-10-CM | POA: Diagnosis not present

## 2021-11-17 ENCOUNTER — Encounter (HOSPITAL_COMMUNITY): Payer: Self-pay

## 2021-11-17 ENCOUNTER — Other Ambulatory Visit: Payer: Self-pay

## 2021-11-17 ENCOUNTER — Emergency Department (HOSPITAL_BASED_OUTPATIENT_CLINIC_OR_DEPARTMENT_OTHER): Payer: Medicare Other

## 2021-11-17 ENCOUNTER — Emergency Department (HOSPITAL_COMMUNITY)
Admission: EM | Admit: 2021-11-17 | Discharge: 2021-11-17 | Disposition: A | Discharge status: Home / Self Care | Payer: Medicare Other | Attending: Emergency Medicine | Admitting: Emergency Medicine

## 2021-11-17 DIAGNOSIS — L03119 Cellulitis of unspecified part of limb: Secondary | ICD-10-CM

## 2021-11-17 DIAGNOSIS — E039 Hypothyroidism, unspecified: Secondary | ICD-10-CM | POA: Diagnosis not present

## 2021-11-17 DIAGNOSIS — R609 Edema, unspecified: Secondary | ICD-10-CM | POA: Diagnosis not present

## 2021-11-17 DIAGNOSIS — Z79899 Other long term (current) drug therapy: Secondary | ICD-10-CM | POA: Insufficient documentation

## 2021-11-17 DIAGNOSIS — L03115 Cellulitis of right lower limb: Secondary | ICD-10-CM | POA: Insufficient documentation

## 2021-11-17 DIAGNOSIS — L03116 Cellulitis of left lower limb: Secondary | ICD-10-CM | POA: Diagnosis not present

## 2021-11-17 DIAGNOSIS — M7989 Other specified soft tissue disorders: Secondary | ICD-10-CM | POA: Diagnosis present

## 2021-11-17 DIAGNOSIS — I10 Essential (primary) hypertension: Secondary | ICD-10-CM | POA: Diagnosis not present

## 2021-11-17 DIAGNOSIS — R031 Nonspecific low blood-pressure reading: Secondary | ICD-10-CM | POA: Diagnosis not present

## 2021-11-17 LAB — CBC WITH DIFFERENTIAL/PLATELET
Abs Immature Granulocytes: 0 10*3/uL (ref 0.00–0.07)
Basophils Absolute: 0 10*3/uL (ref 0.0–0.1)
Basophils Relative: 0 %
Eosinophils Absolute: 0 10*3/uL (ref 0.0–0.5)
Eosinophils Relative: 0 %
HCT: 32.5 % — ABNORMAL LOW (ref 36.0–46.0)
Hemoglobin: 10.2 g/dL — ABNORMAL LOW (ref 12.0–15.0)
Lymphocytes Relative: 37 %
Lymphs Abs: 3.7 10*3/uL (ref 0.7–4.0)
MCH: 31.7 pg (ref 26.0–34.0)
MCHC: 31.4 g/dL (ref 30.0–36.0)
MCV: 100.9 fL — ABNORMAL HIGH (ref 80.0–100.0)
Monocytes Absolute: 0.5 10*3/uL (ref 0.1–1.0)
Monocytes Relative: 5 %
Neutro Abs: 5.9 10*3/uL (ref 1.7–7.7)
Neutrophils Relative %: 58 %
Platelets: 234 10*3/uL (ref 150–400)
RBC: 3.22 MIL/uL — ABNORMAL LOW (ref 3.87–5.11)
RDW: 14.2 % (ref 11.5–15.5)
WBC: 10.1 10*3/uL (ref 4.0–10.5)
nRBC: 0 % (ref 0.0–0.2)
nRBC: 0 /100 WBC

## 2021-11-17 LAB — BASIC METABOLIC PANEL
Anion gap: 11 (ref 5–15)
BUN: 27 mg/dL — ABNORMAL HIGH (ref 8–23)
CO2: 18 mmol/L — ABNORMAL LOW (ref 22–32)
Calcium: 7.8 mg/dL — ABNORMAL LOW (ref 8.9–10.3)
Chloride: 112 mmol/L — ABNORMAL HIGH (ref 98–111)
Creatinine, Ser: 1.46 mg/dL — ABNORMAL HIGH (ref 0.44–1.00)
GFR, Estimated: 35 mL/min — ABNORMAL LOW (ref >60)
Glucose, Bld: 99 mg/dL (ref 70–99)
Potassium: 4 mmol/L (ref 3.5–5.1)
Sodium: 141 mmol/L (ref 135–145)

## 2021-11-17 MED ORDER — VANCOMYCIN HCL IN DEXTROSE 1-5 GM/200ML-% IV SOLN
1000.0000 mg | Freq: Once | INTRAVENOUS | Status: AC
  Administered 2021-11-17: 1000 mg via INTRAVENOUS
  Filled 2021-11-17: qty 200

## 2021-11-17 MED ORDER — DOXYCYCLINE HYCLATE 100 MG PO CAPS: 100.0000 mg | ORAL_CAPSULE | Freq: Two times a day (BID) | ORAL | 0 refills | Status: DC

## 2021-11-17 MED ORDER — SODIUM CHLORIDE 0.9 % IV BOLUS
1000.0000 mL | Freq: Once | INTRAVENOUS | Status: AC
  Administered 2021-11-17: 1000 mL via INTRAVENOUS

## 2021-11-17 MED ORDER — VANCOMYCIN HCL IN DEXTROSE 1-5 GM/200ML-% IV SOLN: 1000.0000 mg | Freq: Once | INTRAVENOUS | Status: DC

## 2021-11-17 NOTE — Discharge Instructions (Addendum)
Take doxycycline twice daily for a week for leg cellulitis.   You have poor circulation in your legs. Please see vascular surgery outpatient   See your primary care doctor in a week   Return to ER if you have worse leg cellulitis, dizziness, passing out

## 2021-11-17 NOTE — ED Triage Notes (Signed)
Pt arrived POV from the drs office c/o bilateral leg swelling and some red spots on her legs. Her doctor was concerned for possible infection.

## 2021-11-17 NOTE — ED Provider Triage Note (Signed)
Emergency Medicine Provider Triage Evaluation Note  Sophia CAULFIELD , a 81 y.o. female  was evaluated in triage.  Pt has history of edema in the lower extremities of his was sent here by her PCP at [place] for possible septic cellulitis.  PCP noted that she had a red spot to the back of her right calf and thought that the right leg looked a bit more swollen than compared to the left.  Reportedly, she had elevated white blood cell count in recent weeks and her blood pressure was low when at at PCP earlier today.  They were concerned that she may be septic and sent her here to the emergency department she denies fevers, chills, leg pain, gait imbalance.  Review of Systems  Positive:  Negative:   Physical Exam  BP (!) 98/48 (BP Location: Right Arm)   Pulse 64   Temp 98.2 F (36.8 C) (Oral)   Resp 16   Ht '5\' 4"'$  (1.626 m)   Wt 70.3 kg   SpO2 100%   BMI 26.61 kg/m  Gen:   Awake, no distress   Resp:  Normal effort  MSK:   Moves extremities without difficulty  Other:  Red spot on back of right calf, not warm to touch. Slightly tender to posterior calf. No appreciable swelling of right compared to left.   Medical Decision Making  Medically screening exam initiated at 2:33 PM.  Appropriate orders placed.  ABEERA FLANNERY was informed that the remainder of the evaluation will be completed by another provider, this initial triage assessment does not replace that evaluation, and the importance of remaining in the ED until their evaluation is complete.  Patient meets no SIRS criteria during triage.  She is nontachycardic, nontoxic-appearing.  No obvious signs of infection and the spot on the back of her leg does not appear infected.  Given that PCP is concerned about the swelling of the right leg compared to the left leg will order DVT ultrasound and recheck CBC to ensure no leukocytosis.   Tonye Pearson, [place] 11/17/21 1438

## 2021-11-17 NOTE — ED Notes (Signed)
Patient verbalizes understanding of d/c instructions. Opportunities for questions and answers were provided. Pt d/c from ED and wheeled to lobby with family.  

## 2021-11-17 NOTE — Progress Notes (Signed)
RLE venous duplex has been completed.  Preliminary results given to Kathe Becton, PA-C.   Results can be found under chart review under CV PROC. 11/17/2021 4:06 PM Price Lachapelle RVT, RDMS

## 2021-11-17 NOTE — ED Provider Notes (Signed)
[facility] EMERGENCY DEPARTMENT Provider Note   CSN: [id number] Arrival date & time: 11/17/21  1340     History  Chief Complaint  Patient presents with   Leg Swelling    LOUISE RAWSON is a 81 y.o. female history of hypertension, hypothyroidism here presenting with leg swelling and possible sepsis.  Patient has chronic leg swelling and got worsening bilateral leg swelling for the last several days.  Patient also noticed some redness of the leg.  Denies any fevers or cough or dysuria.  Patient went to [facility] walk-in and was found to be hypotensive with blood pressure in the 80s.  Patient also had white blood cell count of 12 last week and was sent in for possible sepsis  The history is provided by the patient.       Home Medications Prior to Admission medications   Medication Sig Start Date End Date Taking? Authorizing Provider  aspirin 81 MG tablet Take 81 mg by mouth daily.    [provider]  atorvastatin (LIPITOR) 40 MG tablet Take 20 mg by mouth daily.    [provider]  Calcium Carbonate-Vitamin D (CALCIUM 500+D PO) Take 500 mg by mouth daily. Take one tab    [provider]  diltiazem (TIAZAC) 120 MG 24 hr capsule Take 1 capsule (120 mg total) by mouth daily. 06/15/13   Jettie Booze, MD  furosemide (LASIX) 40 MG tablet Take 40 mg by mouth daily. Take 1/2 tab 12/26/12   [provider]  levothyroxine (SYNTHROID, LEVOTHROID) 50 MCG tablet Take 50 mcg by mouth daily before breakfast.    [provider]  losartan (COZAAR) 50 MG tablet Take 50 mg by mouth daily. One tab 12/02/12   [provider]  metoprolol tartrate (LOPRESSOR) 25 MG tablet Take 25 mg by mouth 2 (two) times daily.    [provider]      Allergies    Penicillins and Bactrim [sulfamethoxazole-trimethoprim]    Review of Systems   Review of Systems  Cardiovascular:  Positive for leg swelling.  All other systems reviewed and are  negative.   Physical Exam Updated Vital Signs BP (!) 93/55   Pulse 72   Temp 98.6 F (37 C) (Oral)   Resp 18   Ht '5\' 4"'$  (1.626 m)   Wt 70.3 kg   SpO2 100%   BMI 26.61 kg/m  Physical Exam Vitals and nursing note reviewed.  HENT:     Head: Normocephalic.     Nose: Nose normal.     Mouth/Throat:     Mouth: Mucous membranes are moist.  Eyes:     Extraocular Movements: Extraocular movements intact.     Pupils: Pupils are equal, round, and reactive to light.  Cardiovascular:     Rate and Rhythm: Normal rate and regular rhythm.     Pulses: Normal pulses.     Heart sounds: Normal heart sounds.  Pulmonary:     Effort: Pulmonary effort is normal.     Breath sounds: Normal breath sounds.  Abdominal:     General: Abdomen is flat.     Palpations: Abdomen is soft.  Musculoskeletal:     Cervical back: Normal range of motion and neck supple.     Comments: 2+ edema, ? Cellulitis right lower leg versus venous stasis changes.  Patient's bilateral feet are cold and have a hard time palpating pulses (chronic per patient)  Neurological:     General: No focal deficit present.  Mental Status: She is alert and oriented to person, place, and time.  Psychiatric:        Mood and Affect: Mood normal.        Behavior: Behavior normal.     ED Results / Procedures / Treatments   Labs (all labs ordered are listed, but only abnormal results are displayed) Labs Reviewed  CBC WITH DIFFERENTIAL/PLATELET - Abnormal; Notable for the following components:      Result Value   RBC 3.22 (*)    Hemoglobin 10.2 (*)    HCT 32.5 (*)    MCV 100.9 (*)    All other components within normal limits  BASIC METABOLIC PANEL - Abnormal; Notable for the following components:   Chloride 112 (*)    CO2 18 (*)    BUN 27 (*)    Creatinine, Ser 1.46 (*)    Calcium 7.8 (*)    GFR, Estimated 35 (*)    All other components within normal limits    EKG None  Radiology VAS [place] LOWER EXTREMITY VENOUS (DVT)  (7a-7p)  Result Date: 11/17/2021  Lower Venous DVT Study Patient Name:  VINCENT EHRLER Plucinski  Date of Exam:   11/17/2021 Medical Rec #: [id number]    Accession #:    [id number] Date of Birth: 08/30/1940    Patient Gender: F Patient Age:   64 years Exam Location:  [facility] Procedure:      VAS [place] LOWER EXTREMITY VENOUS (DVT) Referring Phys: ERICA CONKLIN --------------------------------------------------------------------------------  Indications: Edema.  Comparison Study: Previous exam on 02/28/20 was negaitve for DVT Performing Technologist: Rogelia Rohrer RVT, RDMS  Examination Guidelines: A complete evaluation includes B-mode imaging, spectral Doppler, color Doppler, and power Doppler as needed of all accessible portions of each vessel. Bilateral testing is considered an integral part of a complete examination. Limited examinations for reoccurring indications may be performed as noted. The reflux portion of the exam is performed with the patient in reverse Trendelenburg.  +---------+---------------+---------+-----------+----------+-------------------+ RIGHT    CompressibilityPhasicitySpontaneityPropertiesThrombus Aging      +---------+---------------+---------+-----------+----------+-------------------+ CFV      Full           Yes      Yes                                      +---------+---------------+---------+-----------+----------+-------------------+ SFJ      Full                                                             +---------+---------------+---------+-----------+----------+-------------------+ FV Prox  Full           Yes      Yes                                      +---------+---------------+---------+-----------+----------+-------------------+ FV Mid   Full           Yes      Yes                                      +---------+---------------+---------+-----------+----------+-------------------+  FV DistalFull           Yes      Yes                                       +---------+---------------+---------+-----------+----------+-------------------+ PFV      Full                                                             +---------+---------------+---------+-----------+----------+-------------------+ POP      Full           Yes      Yes                                      +---------+---------------+---------+-----------+----------+-------------------+ PTV                                                   Not well visualized +---------+---------------+---------+-----------+----------+-------------------+ PERO                                                  Not well visualized +---------+---------------+---------+-----------+----------+-------------------+ Calf vessel not well visualized due to diffuse subcutaneous edema  +----+---------------+---------+-----------+----------+--------------+ LEFTCompressibilityPhasicitySpontaneityPropertiesThrombus Aging +----+---------------+---------+-----------+----------+--------------+ CFV Full           Yes      Yes                                 +----+---------------+---------+-----------+----------+--------------+    Summary: RIGHT: - There is no evidence of deep vein thrombosis in the lower extremity. However, portions of this examination were limited- see technologist comments above.  - No cystic structure found in the popliteal fossa. - Diffuse subcutaneous edema from area of popliteal fossa to ankle.  LEFT: - No evidence of common femoral vein obstruction.  *See table(s) above for measurements and observations.    Preliminary     Procedures Procedures    Medications Ordered in ED Medications  sodium chloride 0.9 % bolus 1,000 mL (has no administration in time range)  vancomycin (VANCOCIN) IVPB 1000 mg/200 mL premix (has no administration in time range)    ED Course/ Medical Decision Making/ A&P                           Medical Decision Making ANDRYA ROPPOLO is a 81 y.o.  female here presenting with leg cellulitis and swelling.  Patient has very poor circulation bilateral feet.  Patient states that her toes are always cold and her hard time palpating pulses but this appears to be a chronic issue.  I think patient likely has arterial claudication and perhaps some venous stasis issues .  She may have some cellulitis of the right leg.  Plan to get CBC and BMP and leg ultrasound.  Plan  to also give some IV fluids and patient will need antibiotics  10:31 PM I reviewed patient's labs and independently reviewed DVT study. Patient's labs showed mild renal insufficiency with creatinine 1.4 baseline 0.7.  White blood cell count is normal now.  DVT study is negative.  Patient likely has chronic arterial disease.  Patient was given IV fluids and vancomycin.  Patient blood pressure came up to low 100s.  Patient will be discharged home with doxycycline for cellulitis.  We will have her follow-up with PCP and also with vascular surgery.  Problems Addressed: Cellulitis of lower leg: acute illness or injury  Amount and/or Complexity of Data Reviewed Labs: ordered. Decision-making details documented in ED Course. Radiology: ordered and independent interpretation performed. Decision-making details documented in ED Course.  Risk Prescription drug management.    Final Clinical Impression(s) / ED Diagnoses Final diagnoses:  None    Rx / DC Orders ED Discharge Orders     None         Drenda Freeze, MD 11/17/21 2233

## 2021-11-23 ENCOUNTER — Telehealth: Payer: Self-pay

## 2021-11-23 NOTE — Telephone Encounter (Signed)
Pt's son called stating that the pt was still having a lot of leg swelling following her hospital visit on 7/11. Dr Shirlyn Goltz recommended that she see Dr Donzetta Matters and wanted to make an appt.  Reviewed pt's chart, returned call for clarification, two identifiers used. Pt is not currently a pt here, so the son was asked to contact the pt's PCP to get a referral based on potential chronic arterial disease (as noted in hospital notes). Told him we'd be happy to see her, but needed the referral first. Confirmed understanding.

## 2021-11-30 ENCOUNTER — Other Ambulatory Visit: Payer: Self-pay

## 2021-11-30 ENCOUNTER — Encounter (HOSPITAL_COMMUNITY): Payer: Self-pay | Admitting: *Deleted

## 2021-11-30 ENCOUNTER — Emergency Department (HOSPITAL_COMMUNITY): Payer: Medicare Other

## 2021-11-30 ENCOUNTER — Emergency Department (HOSPITAL_COMMUNITY)
Admission: EM | Admit: 2021-11-30 | Discharge: 2021-11-30 | Disposition: A | Discharge status: Home / Self Care | Payer: Medicare Other | Attending: Emergency Medicine | Admitting: Emergency Medicine

## 2021-11-30 DIAGNOSIS — M47812 Spondylosis without myelopathy or radiculopathy, cervical region: Secondary | ICD-10-CM | POA: Diagnosis not present

## 2021-11-30 DIAGNOSIS — E039 Hypothyroidism, unspecified: Secondary | ICD-10-CM | POA: Insufficient documentation

## 2021-11-30 DIAGNOSIS — M25552 Pain in left hip: Secondary | ICD-10-CM

## 2021-11-30 DIAGNOSIS — Z743 Need for continuous supervision: Secondary | ICD-10-CM | POA: Diagnosis not present

## 2021-11-30 DIAGNOSIS — M25512 Pain in left shoulder: Secondary | ICD-10-CM | POA: Insufficient documentation

## 2021-11-30 DIAGNOSIS — Z23 Encounter for immunization: Secondary | ICD-10-CM | POA: Insufficient documentation

## 2021-11-30 DIAGNOSIS — S0191XA Laceration without foreign body of unspecified part of head, initial encounter: Secondary | ICD-10-CM | POA: Diagnosis not present

## 2021-11-30 DIAGNOSIS — J9811 Atelectasis: Secondary | ICD-10-CM | POA: Diagnosis not present

## 2021-11-30 DIAGNOSIS — M542 Cervicalgia: Secondary | ICD-10-CM | POA: Diagnosis not present

## 2021-11-30 DIAGNOSIS — M19012 Primary osteoarthritis, left shoulder: Secondary | ICD-10-CM | POA: Diagnosis not present

## 2021-11-30 DIAGNOSIS — S0990XA Unspecified injury of head, initial encounter: Secondary | ICD-10-CM | POA: Diagnosis not present

## 2021-11-30 DIAGNOSIS — Y9301 Activity, walking, marching and hiking: Secondary | ICD-10-CM | POA: Insufficient documentation

## 2021-11-30 DIAGNOSIS — M852 Hyperostosis of skull: Secondary | ICD-10-CM | POA: Diagnosis not present

## 2021-11-30 DIAGNOSIS — S43082A Other subluxation of left shoulder joint, initial encounter: Secondary | ICD-10-CM | POA: Diagnosis not present

## 2021-11-30 DIAGNOSIS — W01190A Fall on same level from slipping, tripping and stumbling with subsequent striking against furniture, initial encounter: Secondary | ICD-10-CM | POA: Diagnosis not present

## 2021-11-30 DIAGNOSIS — Z79899 Other long term (current) drug therapy: Secondary | ICD-10-CM | POA: Diagnosis not present

## 2021-11-30 DIAGNOSIS — S0101XA Laceration without foreign body of scalp, initial encounter: Secondary | ICD-10-CM | POA: Diagnosis not present

## 2021-11-30 DIAGNOSIS — Z7982 Long term (current) use of aspirin: Secondary | ICD-10-CM | POA: Insufficient documentation

## 2021-11-30 DIAGNOSIS — W19XXXA Unspecified fall, initial encounter: Secondary | ICD-10-CM | POA: Diagnosis not present

## 2021-11-30 MED ORDER — TETANUS-DIPHTH-ACELL PERTUSSIS 5-2.5-18.5 LF-MCG/0.5 IM SUSY
0.5000 mL | PREFILLED_SYRINGE | Freq: Once | INTRAMUSCULAR | Status: AC
  Administered 2021-11-30: 0.5 mL via INTRAMUSCULAR
  Filled 2021-11-30: qty 0.5

## 2021-11-30 NOTE — ED Notes (Signed)
Family arrives to [facility]. Pt remains alert, NAD, calm, interactive, pleasant.

## 2021-11-30 NOTE — ED Notes (Signed)
Back from radiology, alert, NAD, calm, interactive, no changes.

## 2021-11-30 NOTE — ED Provider Notes (Signed)
[facility] EMERGENCY DEPARTMENT Provider Note   CSN: [id number] Arrival date & time: 11/30/21  1452     History  Chief Complaint  Patient presents with   Sophia Woods is a 81 y.o. female.   Fall   Patient is an 81 year old female with a history of A-fib not on anticoagulation, hypothyroidism, high cholesterol who presents emergency department via EMS for evaluation following a mechanical fall.  According to the patient prior to arrival she was at a restaurant and was getting up and trying to walk with her cane to pay the bill but was accidentally bumped by a fellow patron at [employer].  This caused her to fall onto her left side.  She had the left side of her scalp twice against a chair and landed on her left shoulder and left hip.  She denies loss of consciousness and remembers the entire event.  Bleeding is currently controlled.  Patient now complains of pain to the left side of her head as well as left shoulder pain with bruising and left hip pain.  Patient has bilateral ankle edema which she states is chronic.  She denied any palpitations, chest pain, shortness of breath, lightheadedness or dizziness prior to her fall or currently.     Home Medications Prior to Admission medications   Medication Sig Start Date End Date Taking? Authorizing Provider  aspirin 81 MG tablet Take 81 mg by mouth daily.    [provider]  atorvastatin (LIPITOR) 40 MG tablet Take 20 mg by mouth daily.    [provider]  Calcium Carbonate-Vitamin D (CALCIUM 500+D PO) Take 500 mg by mouth daily. Take one tab    [provider]  diltiazem (TIAZAC) 120 MG 24 hr capsule Take 1 capsule (120 mg total) by mouth daily. 06/15/13   Jettie Booze, MD  doxycycline (VIBRAMYCIN) 100 MG capsule Take 1 capsule (100 mg total) by mouth 2 (two) times daily. One po bid x 7 days 11/17/21   Drenda Freeze, MD  furosemide (LASIX) 40 MG tablet Take 40 mg by mouth  daily. Take 1/2 tab 12/26/12   [provider]  levothyroxine (SYNTHROID, LEVOTHROID) 50 MCG tablet Take 50 mcg by mouth daily before breakfast.    [provider]  losartan (COZAAR) 50 MG tablet Take 50 mg by mouth daily. One tab 12/02/12   [provider]  metoprolol tartrate (LOPRESSOR) 25 MG tablet Take 25 mg by mouth 2 (two) times daily.    [provider]      Allergies    Penicillins and Bactrim [sulfamethoxazole-trimethoprim]    Review of Systems   Review of Systems  Physical Exam Updated Vital Signs BP (!) 111/58   Pulse 61   Temp 98.2 F (36.8 C) (Oral)   Resp 13   Wt 70.3 kg   SpO2 100%   BMI 26.61 kg/m  Physical Exam Vitals and nursing note reviewed.  Constitutional:      General: She is not in acute distress.    Appearance: She is well-developed.  HENT:     Head:     Comments: 2 small areas less than 1 cm of superficial skin tears versus lacerations over the left parietal/temporal scalp.  Bleeding currently controlled.    Right Ear: External ear normal.     Left Ear: External ear normal.     Nose: Nose normal.     Mouth/Throat:     Mouth: Mucous membranes  are moist.     Pharynx: Oropharynx is clear.  Eyes:     Extraocular Movements: Extraocular movements intact.     Conjunctiva/sclera: Conjunctivae normal.     Pupils: Pupils are equal, round, and reactive to light.  Cardiovascular:     Rate and Rhythm: Normal rate and regular rhythm.     Heart sounds: No murmur heard. Pulmonary:     Effort: Pulmonary effort is normal. No respiratory distress.     Breath sounds: Normal breath sounds.  Abdominal:     Palpations: Abdomen is soft.     Tenderness: There is no abdominal tenderness. There is no guarding or rebound.  Musculoskeletal:        General: No swelling.     Cervical back: Neck supple.     Right lower leg: Edema present.     Left lower leg: Edema present.     Comments: 2+ pitting edema bilaterally, chronic. Area of  ecchymosis and tenderness over the patient's left lateral shoulder.  Range of motion limited secondary to chronic changes.  No tenderness to palpation along the wrist, elbow, humerus.  Radial pulses 2+ bilaterally. Tenderness to palpation along the greater trochanter of the left hip.  Strength 5/5 distally in bilateral lower extremities.  DP and PT pulses difficult to palpate secondary to chronic 2+ edema. No evidence of ecchymosis or pain along the chest wall.  Skin:    General: Skin is warm and dry.     Capillary Refill: Capillary refill takes less than 2 seconds.  Neurological:     General: No focal deficit present.     Mental Status: She is alert and oriented to person, place, and time. Mental status is at baseline.  Psychiatric:        Mood and Affect: Mood normal.     ED Results / Procedures / Treatments   Labs (all labs ordered are listed, but only abnormal results are displayed) Labs Reviewed - No data to display  EKG None  Radiology CT Cervical Spine Wo Contrast  Result Date: 11/30/2021 CLINICAL DATA:  Neck trauma (Age >= 65y) neck pain EXAM: CT CERVICAL SPINE WITHOUT CONTRAST TECHNIQUE: Multidetector CT imaging of the cervical spine was performed without intravenous contrast. Multiplanar CT image reconstructions were also generated. RADIATION DOSE REDUCTION: This exam was performed according to the departmental dose-optimization program which includes automated exposure control, adjustment of the mA and/or kV according to patient size and/or use of iterative reconstruction technique. COMPARISON:  None Available. FINDINGS: Alignment: Mild exaggeration of the cervical lordosis Skull base and vertebrae: No acute fracture. No primary bone lesion or focal pathologic process. Moderate cervical spondylosis with prominent endplate osteophytes and facet hypertrophic changes. Soft tissues and spinal canal: No prevertebral fluid or swelling. No visible canal hematoma. Disc levels:  Mild  narrowing of the disc space at C6-C7. Upper chest: Negative. Other: None. IMPRESSION: 1.  No fracture or subluxation. 2.  Moderate cervical spondylosis. Electronically Signed   By: Frazier Richards M.D.   On: 11/30/2021 16:20   CT Head Wo Contrast  Result Date: 11/30/2021 CLINICAL DATA:  81 year old female with head trauma EXAM: CT HEAD WITHOUT CONTRAST TECHNIQUE: Contiguous axial images were obtained from the base of the skull through the vertex without intravenous contrast. RADIATION DOSE REDUCTION: This exam was performed according to the departmental dose-optimization program which includes automated exposure control, adjustment of the mA and/or kV according to patient size and/or use of iterative reconstruction technique. COMPARISON:  MRI 09/29/2010 FINDINGS: Brain: No  evidence of acute infarction, hemorrhage, hydrocephalus, extra-axial collection or mass lesion/mass effect. Mild cerebral volume loss. Vascular: Intracranial vascular calcifications. No hyperdense vessel. Skull: Negative for fracture. Hyperostosis frontalis. Possible small benign osteoma in the left frontal sinus. Sinuses/Orbits: Mild mucosal thickening in the right ethmoid air cells. The paranasal sinuses and mastoid air cells are clear. Other: None. IMPRESSION: No acute intracranial abnormality or calvarial fracture. Electronically Signed   By: Placido Sou M.D.   On: 11/30/2021 16:17   DG Chest 2 View  Result Date: 11/30/2021 CLINICAL DATA:  Fall EXAM: CHEST - 2 VIEW COMPARISON:  Chest x-ray dated May 25th 2011 FINDINGS: Cardiac and mediastinal contours are unchanged. Mild bibasilar atelectasis. No focal consolidation. No pleural effusion or pneumothorax. IMPRESSION: No active cardiopulmonary disease. Electronically Signed   By: Yetta Glassman M.D.   On: 11/30/2021 16:06   DG Shoulder Left  Result Date: 11/30/2021 CLINICAL DATA:  fall left shoulder pain EXAM: LEFT SHOULDER - 2+ VIEW COMPARISON:  None Available. FINDINGS: There is  no evidence of fracture or dislocation. Mild superior subluxation of the humeral head with loss of the acromiohumeral space of likely rotator cuff tear. Degenerative/hypertrophic changes at the [facility] joint. Osteopenia. Old healed left-sided rib fracture. IMPRESSION: No fracture. Superior subluxation of the humeral head of likely rotator cuff tear. Osteopenia. Electronically Signed   By: Frazier Richards M.D.   On: 11/30/2021 16:05   DG Hip Unilat W or Wo Pelvis 2-3 Views Left  Result Date: 11/30/2021 CLINICAL DATA:  [place] today.  Left lateral hip pain. EXAM: DG HIP (WITH OR WITHOUT PELVIS) 2-3V LEFT COMPARISON:  None Available. FINDINGS: There is no evidence of hip fracture or dislocation. There is no evidence of arthropathy or other focal bone abnormality. IMPRESSION: Negative. Electronically Signed   By: Nelson Chimes M.D.   On: 11/30/2021 16:03    Procedures .Ortho Injury Treatment  Date/Time: 11/30/2021 5:50 PM  Performed by: Nelta Numbers, MD Authorized by: Luna Fuse, MD   Consent:    Consent obtained:  Verbal   Consent given by:  Patient   Risks discussed:  Stiffness and restricted joint movementInjury location: shoulder Location details: left shoulder Injury type: soft tissue Pre-procedure distal perfusion: normal Pre-procedure neurological function: normal Immobilization: sling Splint Applied by: ED Tech Post-procedure distal perfusion: normal Post-procedure neurological function: normal Post-procedure range of motion: normal       Medications Ordered in ED Medications  Tdap (BOOSTRIX) injection 0.5 mL (0.5 mLs Intramuscular Given 11/30/21 1625)    ED Course/ Medical Decision Making/ A&P                           Medical Decision Making Problems Addressed: Acute pain of left shoulder: complicated acute illness or injury with systemic symptoms Fall, initial encounter: complicated acute illness or injury with systemic symptoms Left hip pain: complicated acute illness or  injury with systemic symptoms Superficial laceration of head: complicated acute illness or injury  Amount and/or Complexity of Data Reviewed Independent Historian: guardian and EMS External Data Reviewed: notes. Radiology: ordered. ECG/medicine tests: ordered and independent interpretation performed.    Details: Evidence of right bundle branch block.  Rate 62.  QTc 482.  No evidence of acute ST or T wave abnormalities to suggest underlying ACS.  Risk Prescription drug management.   Patient is a 81 year old female who presents emergency department as above.  On initial presentation patient's vital signs are stable she is afebrile.  Physical  exam does reveal some ecchymosis over her left shoulder and 2 small skin tears versus superficial lacerations over the left side of her scalp.  Scalp lacerations currently hemostatic.  Patient is also having some tenderness palpation in her left hip.  Patient currently denies any palpitations, shortness of breath, chest pain.  She denies any nausea or diaphoresis.  She states that her fall was purely mechanical and she feels at her baseline otherwise.  Given her age a CT of her head and C-spine will be ordered as well as x-rays of her left shoulder, left hip as well as chest and pelvis.  Baseline x-rays of her chest showed no acute cardiopulmonary abnormalities.  X-ray of the pelvis negative.  Left shoulder x-ray showed no acute fractures but superior subluxation of the humeral head likely rotator cuff tear.  Also evidence of osteopenia.  CT head and C-spine were both negative for acute findings.  According to the patient she has been having ongoing chronic shoulder issues for quite some time.  She states that her strength and range of motion in her left shoulder feel at her baseline.  She has some mild tenderness to palpation over the area of ecchymosis on her left shoulder.  The follow-up information for orthopedics was given to the patient should she feel the  need to follow-up for evaluation and possible underlying rotator cuff repair.  She was also given a sling for comfort to use.  She was also instructed follow-up with her primary care provider as needed.  Plan and findings discussed with patient and family at bedside both verbalized understanding and were agreeable.  Patient discharged in the ED in stable condition.        Final Clinical Impression(s) / ED Diagnoses Final diagnoses:  Fall, initial encounter  Acute pain of left shoulder  Left hip pain  Superficial laceration of head    Rx / DC Orders ED Discharge Orders     None         Nelta Numbers, MD 11/30/21 1755    Luna Fuse, MD 12/01/21 2043

## 2021-11-30 NOTE — ED Notes (Signed)
Delay in d/c d/t influx of critical pts.  

## 2021-11-30 NOTE — ED Triage Notes (Addendum)
BIB [facility] from restaurant where pt had a mechanical fall, tripped while walking with cane when she went to pay bill. Denies dizziness, weakness, sickness, LOC, blood thinners. Fall was witnessed. No LOC. Fell onto L side. 2 skin tear/lacs noted to L parietal head. No active bleeding. C/o L head, shoulder, hip, and ankle pain. BLE ankle edema noted. H/o stroke and aneurysm. Alert, NAD, calm, interactive, resps e/u, speaking in clear complete sentences. Bruising to L shoulder noted. Some bruising possible to L ankle.

## 2021-11-30 NOTE — Discharge Instructions (Signed)
You are seen in the emergency department for evaluation of a mechanical fall while at a restaurant.  X-rays performed at this facility were negative.  There was evidence of prior injuries to your left shoulder and signs suggestive of a rotator cuff injury.  We listed the number above for you to follow-up in the outpatient setting with orthopedics should you want to follow-up with them.  You were given a sling for comfort.  Please be mindful of using his sling while using your cane.  It may be best to use your sling while at rest.  Please also follow-up with your primary care provider in the coming days for symptom recheck as needed.

## 2021-11-30 NOTE — ED Notes (Signed)
Pt in radiology 

## 2021-12-03 DIAGNOSIS — S8002XD Contusion of left knee, subsequent encounter: Secondary | ICD-10-CM | POA: Diagnosis not present

## 2021-12-03 DIAGNOSIS — S0101XD Laceration without foreign body of scalp, subsequent encounter: Secondary | ICD-10-CM | POA: Diagnosis not present

## 2021-12-03 DIAGNOSIS — R609 Edema, unspecified: Secondary | ICD-10-CM | POA: Diagnosis not present

## 2021-12-03 DIAGNOSIS — L309 Dermatitis, unspecified: Secondary | ICD-10-CM | POA: Diagnosis not present

## 2021-12-15 ENCOUNTER — Other Ambulatory Visit: Payer: Self-pay | Admitting: *Deleted

## 2021-12-15 DIAGNOSIS — M79606 Pain in leg, unspecified: Secondary | ICD-10-CM

## 2021-12-24 ENCOUNTER — Ambulatory Visit (HOSPITAL_COMMUNITY)
Admission: RE | Admit: 2021-12-24 | Discharge: 2021-12-24 | Disposition: A | Discharge status: Home / Self Care | Payer: Medicare Other | Source: Ambulatory Visit | Attending: Vascular Surgery | Admitting: Vascular Surgery

## 2021-12-24 DIAGNOSIS — M79606 Pain in leg, unspecified: Secondary | ICD-10-CM | POA: Diagnosis not present

## 2021-12-25 ENCOUNTER — Encounter: Payer: Self-pay | Admitting: Vascular Surgery

## 2021-12-25 ENCOUNTER — Ambulatory Visit: Payer: Medicare Other | Admitting: Vascular Surgery

## 2021-12-25 VITALS — BP 163/75 | HR 55 | Temp 98.3°F | Resp 20 | Ht 64.0 in | Wt 170.0 lb

## 2021-12-25 DIAGNOSIS — I890 Lymphedema, not elsewhere classified: Secondary | ICD-10-CM | POA: Diagnosis not present

## 2021-12-25 NOTE — Progress Notes (Signed)
Office Note     CC: Bilateral lower extremity edema Requesting Provider:  Carol Ada, MD  HPI: Sophia Woods is a 81 y.o. (July 21, 1940) female presenting at the request of .Carol Ada, MD with bilateral lower extremity edema.  Sophia Woods presents today accompanied today by her son, who is her primary caregiver.  They first appreciated bilateral pitting edema begining months ago.  She is unsure of a precipitating event.  At baseline, Darsi is relatively immobile, using a walker to ambulate.  The bilateral lower extremity edema and associated heaviness has further limited her ambulation and limited her ability to exercise as the legs are so large, she has difficulty raising them up onto the couch or rising from a sitting position without help.  She sits on the couch for the majority of the day.  Her son works 14-hour shifts and her grandchildren stop by intermittently in an effort to provide lunch and help her with her needs.  She denies symptoms of claudication, ischemic rest pain, tissue loss.  She denies varicose veins, color changes in the calves.  When  Sophia Woods wakes up in the morning, her legs are much smaller and she describes her feet and calves as looking like prunes.  When she moves back to the dependent position, the swelling returns almost instantly.  Swelling affects the toes, forefoot, calves.   Over the last year, Sophia Woods has had over 70 pounds of loss.  Having multiple watery bowel movements daily and therefore stopped eating in an effort to control her bowels.  This led to difficult weight loss and malnutrition.  Her appetite has recently improved, and she is drinking Ensure shakes in an effort to get adequate caloric intake.  Past Medical History:  Diagnosis Date   Atrial fibrillation (Diagonal)    Hypercholesteremia    Hypothyroidism    Obesity     Past Surgical History:  Procedure Laterality Date   CHOLECYSTECTOMY     eye irritation/pterygium removed     HERNIA REPAIR     TOE SURGERY       Social History   Socioeconomic History   Marital status: Divorced    Spouse name: Not on file   Number of children: Not on file   Years of education: Not on file   Highest education level: Not on file  Occupational History   Not on file  Tobacco Use   Smoking status: Former    Packs/day: 2.00    Types: Cigarettes    Quit date: 08/31/1988    Years since quitting: 33.3   Smokeless tobacco: Not on file  Substance and Sexual Activity   Alcohol use: Not on file   Drug use: Not on file   Sexual activity: Not on file  Other Topics Concern   Not on file  Social History Narrative   Not on file   Social Determinants of Health   Financial Resource Strain: Not on file  Food Insecurity: Not on file  Transportation Needs: Not on file  Physical Activity: Not on file  Stress: Not on file  Social Connections: Not on file  Intimate Partner Violence: Not on file   Family History  Problem Relation Age of Onset   Cancer - Prostate Father    Hypertension Mother     Current Outpatient Medications  Medication Sig Dispense Refill   aspirin 81 MG tablet Take 81 mg by mouth daily.     atorvastatin (LIPITOR) 40 MG tablet Take 20 mg by mouth daily.  Calcium Carbonate-Vitamin D (CALCIUM 500+D PO) Take 500 mg by mouth daily. Take one tab     diltiazem (TIAZAC) 120 MG 24 hr capsule Take 1 capsule (120 mg total) by mouth daily. 30 capsule 9   doxycycline (VIBRAMYCIN) 100 MG capsule Take 1 capsule (100 mg total) by mouth 2 (two) times daily. One po bid x 7 days 14 capsule 0   furosemide (LASIX) 40 MG tablet Take 40 mg by mouth daily. Take 1/2 tab     levothyroxine (SYNTHROID, LEVOTHROID) 50 MCG tablet Take 50 mcg by mouth daily before breakfast.     losartan (COZAAR) 50 MG tablet Take 50 mg by mouth daily. One tab     metoprolol tartrate (LOPRESSOR) 25 MG tablet Take 25 mg by mouth 2 (two) times daily.     No current facility-administered medications for this visit.    Allergies   Allergen Reactions   Penicillins     Hives   Bactrim [Sulfamethoxazole-Trimethoprim] Swelling     REVIEW OF SYSTEMS:  '[X]'$  denotes positive finding, '[ ]'$  denotes negative finding Cardiac  Comments:  Chest pain or chest pressure:    Shortness of breath upon exertion:    Short of breath when lying flat:    Irregular heart rhythm:        Vascular    Pain in calf, thigh, or hip brought on by ambulation:    Pain in feet at night that wakes you up from your sleep:     Blood clot in your veins:    Leg swelling:         Pulmonary    Oxygen at home:    Productive cough:     Wheezing:         Neurologic    Sudden weakness in arms or legs:     Sudden numbness in arms or legs:     Sudden onset of difficulty speaking or slurred speech:    Temporary loss of vision in one eye:     Problems with dizziness:         Gastrointestinal    Blood in stool:     Vomited blood:         Genitourinary    Burning when urinating:     Blood in urine:        Psychiatric    Major depression:         Hematologic    Bleeding problems:    Problems with blood clotting too easily:        Skin    Rashes or ulcers:        Constitutional    Fever or chills:      PHYSICAL EXAMINATION:  There were no vitals filed for this visit.  General:  WDWN in NAD; vital signs documented above Gait: Not observed HENT: WNL, normocephalic Pulmonary: normal non-labored breathing , without wheezing Cardiac: regular HR, bruit Abdomen: soft, NT, no masses Skin: without rashes Vascular Exam/Pulses:  Right Left  Radial 2+ (normal) 2+ (normal)  Ulnar    Femoral    Popliteal    DP 2+ (normal) 2+ (normal)  PT     Extremities: without ischemic changes, without Gangrene , without cellulitis; without open wounds;  Stemmer sign Musculoskeletal: no muscle wasting or atrophy  Neurologic: A&O X 3;  No focal weakness or paresthesias are detected Psychiatric:  The pt has Normal affect.   Non-Invasive Vascular  Imaging:   ABI Findings:  +---------+------------------+-----+-----------+--------+  Right  Rt Pressure (mmHg)IndexWaveform   Comment   +---------+------------------+-----+-----------+--------+  Brachial 193                    triphasic            +---------+------------------+-----+-----------+--------+  PTA      210               1.09 monophasic           +---------+------------------+-----+-----------+--------+  DP       198               1.03 multiphasic          +---------+------------------+-----+-----------+--------+  Great Toe150               0.78 Normal               +---------+------------------+-----+-----------+--------+   +---------+------------------+-----+-----------+-------+  Left     Lt Pressure (mmHg)IndexWaveform   Comment  +---------+------------------+-----+-----------+-------+  Brachial 179                    triphasic           +---------+------------------+-----+-----------+-------+  PTA      165               0.85 monophasic          +---------+------------------+-----+-----------+-------+  DP       221               1.15 multiphasic         +---------+------------------+-----+-----------+-------+  Great Toe150               0.78 Normal              +---------+------------------+-----+-----------+-------+   +-------+-----------+-----------+------------+------------+  ABI/TBIToday's ABIToday's TBIPrevious ABIPrevious TBI  +-------+-----------+-----------+------------+------------+  Right  1.09       0.78                                 +-------+-----------+-----------+------------+------------+  Left   1.15       0.78                                 +-------+-----------+-----------+------------+------------+   Summary:  RIGHT:  - There is no evidence of deep vein thrombosis in the lower extremity.  However, portions of this examination were limited- see technologist  comments  above.     - No cystic structure found in the popliteal fossa.  - Diffuse subcutaneous edema from area of popliteal fossa to ankle.     LEFT:  - No evidence of common femoral vein obstruction.   ASSESSMENT/PLAN: BREEZE Woods is a 81 y.o. female presenting with bilateral lower extremity lymphedema.  ABIs were reviewed, and found to be normal.  Lower extremity venous duplex demonstrated no concern for DVT.   At this time, legs are too large for standard compression therapy.  Due to the weight of her legs, she has difficulty with ambulation without the assistance of family members.  Over the last 4 weeks, she has appreciated significant improvement when able to elevate.  The heaviness also limits her ability to exercise as she is unable to reach a standing position without assistance.  Bilateral lower extremities are too large for standard compression therapy at this time.  Current lymphedema is defined as stage II with mild hyperpigmentation.  I think she would benefit from medic compression therapy as well as lymphedema wraps as both her and her son would have difficulty with standard compression therapy -even at a custom size.  During her visit we also discussed referral to [facility] lymphedema clinic.  We will hold on this and try to manage her with local care.   Broadus John, MD [facility] [phone]

## 2022-01-04 DIAGNOSIS — K648 Other hemorrhoids: Secondary | ICD-10-CM | POA: Diagnosis not present

## 2022-01-15 DIAGNOSIS — I890 Lymphedema, not elsewhere classified: Secondary | ICD-10-CM | POA: Diagnosis not present

## 2022-01-22 DIAGNOSIS — R350 Frequency of micturition: Secondary | ICD-10-CM | POA: Diagnosis not present

## 2022-04-08 DIAGNOSIS — Z23 Encounter for immunization: Secondary | ICD-10-CM | POA: Diagnosis not present

## 2022-04-08 DIAGNOSIS — I10 Essential (primary) hypertension: Secondary | ICD-10-CM | POA: Diagnosis not present

## 2022-04-08 DIAGNOSIS — I480 Paroxysmal atrial fibrillation: Secondary | ICD-10-CM | POA: Diagnosis not present

## 2022-04-08 DIAGNOSIS — E7800 Pure hypercholesterolemia, unspecified: Secondary | ICD-10-CM | POA: Diagnosis not present

## 2022-04-08 DIAGNOSIS — E440 Moderate protein-calorie malnutrition: Secondary | ICD-10-CM | POA: Diagnosis not present

## 2022-04-08 DIAGNOSIS — R609 Edema, unspecified: Secondary | ICD-10-CM | POA: Diagnosis not present

## 2022-04-08 DIAGNOSIS — E039 Hypothyroidism, unspecified: Secondary | ICD-10-CM | POA: Diagnosis not present

## 2022-04-08 DIAGNOSIS — R944 Abnormal results of kidney function studies: Secondary | ICD-10-CM | POA: Diagnosis not present

## 2022-04-08 DIAGNOSIS — L602 Onychogryphosis: Secondary | ICD-10-CM | POA: Diagnosis not present

## 2022-05-07 ENCOUNTER — Encounter: Payer: Self-pay | Admitting: Podiatry

## 2022-05-07 ENCOUNTER — Ambulatory Visit: Payer: Medicare Other | Admitting: Podiatry

## 2022-05-07 DIAGNOSIS — B351 Tinea unguium: Secondary | ICD-10-CM | POA: Diagnosis not present

## 2022-05-07 DIAGNOSIS — M79674 Pain in right toe(s): Secondary | ICD-10-CM

## 2022-05-07 DIAGNOSIS — M79675 Pain in left toe(s): Secondary | ICD-10-CM

## 2022-05-07 NOTE — Progress Notes (Signed)
Subjective:   Patient ID: Sophia Woods, female   DOB: 81 y.o.   MRN: [id number]   HPI Patient presents with caregiver with thick yellow brittle nailbeds 1-5 both feet that they cannot take care of and they get tender and sore.  Patient does not smoke currently likes to be active if possible   Review of Systems  All other systems reviewed and are negative.       Objective:  Physical Exam Vitals and nursing note reviewed.  Constitutional:      Appearance: She is well-developed.  Pulmonary:     Effort: Pulmonary effort is normal.  Musculoskeletal:        General: Normal range of motion.  Skin:    General: Skin is warm.  Neurological:     Mental Status: She is alert.     Neurovascular status found to be intact muscle strength is moderately reduced range of motion reduced subtalar midtarsal joint.  Patient has severe thickness of nailbeds 1-5 both feet dystrophic painful when pressed and incurvated into the corners 1-5 both feet     Assessment:  Mycotic nail infection with pain 1-5 both feet     Plan:  Debridement of nailbeds 1-5 both feet no iatrogenic bleeding reappoint routine care.  Did H&P discussed daily inspections with caregiver

## 2022-08-13 ENCOUNTER — Ambulatory Visit: Payer: Medicare Other | Admitting: Podiatry

## 2022-10-26 DIAGNOSIS — D649 Anemia, unspecified: Secondary | ICD-10-CM | POA: Diagnosis not present

## 2022-10-26 DIAGNOSIS — N1832 Chronic kidney disease, stage 3b: Secondary | ICD-10-CM | POA: Diagnosis not present

## 2022-10-26 DIAGNOSIS — E039 Hypothyroidism, unspecified: Secondary | ICD-10-CM | POA: Diagnosis not present

## 2022-10-26 DIAGNOSIS — R609 Edema, unspecified: Secondary | ICD-10-CM | POA: Diagnosis not present

## 2022-10-26 DIAGNOSIS — I480 Paroxysmal atrial fibrillation: Secondary | ICD-10-CM | POA: Diagnosis not present

## 2022-10-26 DIAGNOSIS — N6320 Unspecified lump in the left breast, unspecified quadrant: Secondary | ICD-10-CM | POA: Diagnosis not present

## 2022-10-26 DIAGNOSIS — E785 Hyperlipidemia, unspecified: Secondary | ICD-10-CM | POA: Diagnosis not present

## 2022-10-26 DIAGNOSIS — R011 Cardiac murmur, unspecified: Secondary | ICD-10-CM | POA: Diagnosis not present

## 2022-10-26 DIAGNOSIS — I10 Essential (primary) hypertension: Secondary | ICD-10-CM | POA: Diagnosis not present

## 2022-10-26 DIAGNOSIS — Z0000 Encounter for general adult medical examination without abnormal findings: Secondary | ICD-10-CM | POA: Diagnosis not present

## 2022-10-27 ENCOUNTER — Other Ambulatory Visit: Payer: Self-pay | Admitting: Family Medicine

## 2022-10-27 DIAGNOSIS — N6320 Unspecified lump in the left breast, unspecified quadrant: Secondary | ICD-10-CM

## 2022-11-10 ENCOUNTER — Ambulatory Visit
Admission: RE | Admit: 2022-11-10 | Discharge: 2022-11-10 | Disposition: A | Discharge status: Home / Self Care | Payer: Medicare Other | Source: Ambulatory Visit | Attending: Family Medicine | Admitting: Family Medicine

## 2022-11-10 DIAGNOSIS — N6325 Unspecified lump in the left breast, overlapping quadrants: Secondary | ICD-10-CM | POA: Diagnosis not present

## 2022-11-10 DIAGNOSIS — N6320 Unspecified lump in the left breast, unspecified quadrant: Secondary | ICD-10-CM

## 2022-11-10 DIAGNOSIS — R921 Mammographic calcification found on diagnostic imaging of breast: Secondary | ICD-10-CM | POA: Diagnosis not present

## 2022-11-12 ENCOUNTER — Other Ambulatory Visit: Payer: Self-pay | Admitting: Family Medicine

## 2022-11-12 DIAGNOSIS — N6320 Unspecified lump in the left breast, unspecified quadrant: Secondary | ICD-10-CM

## 2022-11-18 ENCOUNTER — Ambulatory Visit
Admission: RE | Admit: 2022-11-18 | Discharge: 2022-11-18 | Disposition: A | Discharge status: Home / Self Care | Payer: Medicare Other | Source: Ambulatory Visit | Attending: Family Medicine | Admitting: Family Medicine

## 2022-11-18 DIAGNOSIS — N6342 Unspecified lump in left breast, subareolar: Secondary | ICD-10-CM | POA: Diagnosis not present

## 2022-11-18 DIAGNOSIS — N6325 Unspecified lump in the left breast, overlapping quadrants: Secondary | ICD-10-CM | POA: Diagnosis not present

## 2022-11-18 DIAGNOSIS — N6320 Unspecified lump in the left breast, unspecified quadrant: Secondary | ICD-10-CM

## 2022-11-18 DIAGNOSIS — C50112 Malignant neoplasm of central portion of left female breast: Secondary | ICD-10-CM | POA: Diagnosis not present

## 2022-11-18 DIAGNOSIS — C50812 Malignant neoplasm of overlapping sites of left female breast: Secondary | ICD-10-CM | POA: Diagnosis not present

## 2022-11-18 HISTORY — PX: BREAST BIOPSY: SHX20

## 2022-11-19 ENCOUNTER — Telehealth: Payer: Self-pay | Admitting: Hematology and Oncology

## 2022-11-19 NOTE — Telephone Encounter (Signed)
LVM for son Jennette Kettle to return my call in reference to upcoming [facility] appointment on 7/17 at 1215, left my call back number mailing packet

## 2022-11-22 ENCOUNTER — Telehealth: Payer: Self-pay | Admitting: Hematology and Oncology

## 2022-11-22 ENCOUNTER — Encounter: Payer: Self-pay | Admitting: *Deleted

## 2022-11-22 DIAGNOSIS — C50112 Malignant neoplasm of central portion of left female breast: Secondary | ICD-10-CM | POA: Insufficient documentation

## 2022-11-22 DIAGNOSIS — C50412 Malignant neoplasm of upper-outer quadrant of left female breast: Secondary | ICD-10-CM | POA: Insufficient documentation

## 2022-11-22 NOTE — Telephone Encounter (Signed)
Called and LVM for patients son again, asked for a return call and letting him know that I did mail packet and making sure that he got it and that he understands the arrival time of 1215

## 2022-11-23 NOTE — Progress Notes (Signed)
Radiation Oncology         [phone] ________________________________  Initial Outpatient Consultation  Name: Sophia Woods MRN: [id number]  Date: 11/24/2022  DOB: [birth year]  ZD:[id number], Sophia Masters, MD  Emelia Loron, MD   REFERRING PHYSICIAN: Emelia Loron, MD  DIAGNOSIS:    ICD-10-CM   1. 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 [facility]) Staging form: Breast, AJCC 8th Edition - Clinical stage from 11/24/2022: Stage IIIA (cT3, cN0, cM0, G3, ER+, PR-, HER2-) - Unsigned Stage prefix: Initial diagnosis Histologic grading system: 3 grade system Laterality: Left Staged by: Pathologist and managing physician Stage used in treatment planning: Yes National guidelines used in treatment planning: Yes Type of national guideline used in treatment planning: NCCN  Malignant neoplasm of upper-outer quadrant of left female breast (HCC) Staging form: Breast, AJCC 8th Edition - Clinical stage from 11/24/2022: Stage IIB (cT3, cN0, cM0, G1, ER+, PR-, HER2-) - Signed by [name], MD on 11/24/2022 Stage prefix: Initial diagnosis Histologic grading system: 3 grade system Laterality: Left Staged by: Pathologist and managing physician Stage used in treatment planning: Yes National guidelines used in treatment planning: Yes Type of national guideline used in treatment planning: NCCN  Stage IIIA (cT3, cN0, cM0) Retroareolar Left Breast, 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 female who presented with a palpable left breast mass earlier this month. She subsequently presented for a bilateral diagnostic breast mammogram  and left breast ultrasound on 11/10/22 which demonstrated:  -- a highly suspicious 5.1 cm retroareolar upper left breast mass     -- a highly suspicious 0.8 cm mass in the 2 o'clock left breast -- a highly suspicious 0.8 cm mass in the inner left breast located immediately adjacent to the 5.1 cm left breast mass. -- highly suspicious calcifications involving and adjacent to the highly suspicious 5.1 cm left breast mass -- imaging otherwise showed no abnormal appearing left axillary lymph nodes and no abnormal findings in the right breast.   Biopsies collected on 11/18/22 are detailed as follows:  -- Biopsy of the retroareolar left breast mass showed grade 3 invasive poorly differentiated ductal carcinoma measuring 10 mm in the greatest linear extent of the sample with focal high-grade DCIS. ER status: 100% positive with strong staining intensity; PR status 0% negative; Proliferation marker Ki67 at 40%; Her2 status pending; Grade 3. -- Biopsy of the 12 o'clock left breast mass showed grade 1 invasive well differentiated ductal carcinoma measuring 2.9 mm in the greatest linear extent of the sample. ER status: 100% positive with strong staining intensity; PR status 0% negative; Proliferation marker Ki67 at 10%; Her2 status negative; Grade 1.  Her case was discussed at our multidisciplinary breast clinic this morning. We are seeing her today to discuss possible adjuvant radiation therapy.  PREVIOUS RADIATION THERAPY: No  PAST MEDICAL HISTORY:  has a past medical history of Atrial fibrillation (HCC), Family history of breast cancer, Family history of prostate cancer, Family history of stomach cancer, Hypercholesteremia, Hypothyroidism, and Obesity.    PAST SURGICAL HISTORY: Past Surgical History:  Procedure Laterality Date   BREAST BIOPSY Left  11/18/2022   [place] LT BREAST BX W LOC DEV 1ST LESION IMG BX SPEC US GUIDE 11/18/2022 [facility]   BREAST BIOPSY Left 11/18/2022   [place] LT BREAST BX W LOC  DEV EA ADD LESION IMG BX SPEC US GUIDE 11/18/2022 [facility]   CHOLECYSTECTOMY     eye irritation/pterygium removed     HERNIA REPAIR     TOE SURGERY      FAMILY HISTORY: family history includes Bone cancer in her maternal uncle; Breast cancer in her cousin and maternal aunt; Breast cancer (age of onset: 71) in her sister; Cancer in her maternal aunt and maternal uncle; Cancer - Prostate in her father; Hypertension in her mother; Lung cancer in her maternal uncle; Prostate cancer (age of onset: 6) in her son; Stomach cancer in her maternal aunt, maternal uncle, and paternal grandfather.  SOCIAL HISTORY:  reports that she quit smoking about 34 years ago. Her smoking use included cigarettes. She does not have any smokeless tobacco history on file.  ALLERGIES: Penicillins and Bactrim [sulfamethoxazole-trimethoprim]  MEDICATIONS:  Current Outpatient Medications  Medication Sig Dispense Refill   aspirin 81 MG tablet Take 81 mg by mouth daily.     atorvastatin (LIPITOR) 40 MG tablet Take 20 mg by mouth daily.     Calcium Carbonate-Vitamin D (CALCIUM 500+D PO) Take 500 mg by mouth daily. Take one tab     diltiazem (TIAZAC) 120 MG 24 hr capsule Take 1 capsule (120 mg total) by mouth daily. 30 capsule 9   doxycycline (VIBRAMYCIN) 100 MG capsule Take 1 capsule (100 mg total) by mouth 2 (two) times daily. One po bid x 7 days 14 capsule 0   furosemide (LASIX) 40 MG tablet Take 40 mg by mouth daily. Take 1/2 tab     levothyroxine (SYNTHROID, LEVOTHROID) 50 MCG tablet Take 50 mcg by mouth daily before breakfast.     losartan (COZAAR) 50 MG tablet Take 50 mg by mouth daily. One tab     metoprolol tartrate (LOPRESSOR) 25 MG tablet Take 25 mg by mouth 2 (two) times daily.     No current facility-administered medications for this encounter.   Facility-Administered Medications Ordered in Other Encounters  Medication Dose Route Frequency Provider Last Rate Last Admin   tranexamic acid (CYKLOKAPRON)  3,000 mg in sodium chloride 0.9 % 100 mL Topical Application  3,000 mg Topical Once Emelia Loron, MD        REVIEW OF SYSTEMS: As above in HPI.   PHYSICAL EXAM:  vitals were not taken for this visit.   In general this is a well appearing female in no acute distress. She's alert and oriented x4 and appropriate throughout the examination. Cardiopulmonary assessment is negative for acute distress and she exhibits normal effort.     Breasts: left breast exam revealed a palpable central mass approximately 5 cm in greatest dimension. Moderate bruising visualized around the biopsy site.  MSK: Patient exhibits limited ROM in her left shoulder. Is able to extend arm approximately 60 degrees.    Ext: LE edema  ECOG = 2  0 - Asymptomatic (Fully active, able to carry on all predisease activities without restriction)  1 - Symptomatic but completely ambulatory (Restricted in physically strenuous activity but ambulatory and able to carry out work of a light or sedentary nature. For example, light housework, office work)  2 - Symptomatic, <50% in bed during the day (Ambulatory and capable of all self care but unable to carry out any work activities.  Up and about more than 50% of waking hours)  3 - Symptomatic, >50% in bed, but not bedbound (Capable of only limited self-care, confined to bed or chair 50% or more of waking hours)  4 - Bedbound (Completely disabled. Cannot carry on any self-care. Totally confined to bed or chair)  5 - Death   Santiago Glad MM, Creech RH, Tormey DC, et al. [phone]). "Toxicity and response criteria of the [facility]". Am. Evlyn Clines. Oncol. 5 (6): 649-55   LABORATORY DATA:  Lab Results  Component Value Date   WBC 9.6 11/24/2022   HGB 10.4 (L) 11/24/2022   HCT 32.9 (L) 11/24/2022   MCV 97.3 11/24/2022   PLT 221 11/24/2022   CMP     Component Value Date/Time   NA 143 11/24/2022 1245   K 4.7 11/24/2022 1245   CL 113 (H) 11/24/2022 1245   CO2 25  11/24/2022 1245   GLUCOSE 90 11/24/2022 1245   BUN 60 (H) 11/24/2022 1245   CREATININE 1.44 (H) 11/24/2022 1245   CALCIUM 9.3 11/24/2022 1245   PROT 6.5 11/24/2022 1245   ALBUMIN 3.8 11/24/2022 1245   AST 15 11/24/2022 1245   ALT 13 11/24/2022 1245   ALKPHOS 53 11/24/2022 1245   BILITOT 0.4 11/24/2022 1245   GFRNONAA 36 (L) 11/24/2022 1245   GFRAA  08/12/2010 0728    >60        The eGFR has been calculated using the MDRD equation. This calculation has not been validated in all clinical situations. eGFR's persistently <60 mL/min signify possible Chronic Kidney Disease.         RADIOGRAPHY: [place] LT BREAST BX W LOC DEV 1ST LESION IMG BX SPEC US GUIDE  Addendum Date: 11/19/2022   ADDENDUM REPORT: 11/19/2022 13:58 ADDENDUM: Pathology revealed GRADE III INVASIVE POORLY DIFFERENTIATED DUCTAL ADENOCARCINOMA WITH FOCAL APOCRINE FEATURES, FOCAL HIGH-GRADE DUCTAL CARCINOMA IN SITU, CRIBRIFORM TYPE WITH APOCRINE FEATURES AND NECROSIS, MICROCALCIFICATIONS PRESENT WITHIN DCIS of the LEFT breast, retroareolar mass, (venus clip). This was found to be concordant by Dr. Frederico Hamman. Pathology revealed GRADE I INVASIVE WELL-DIFFERENTIATED DUCTAL ADENOCARCINOMA of the LEFT breast, 12:00 o'clock, (coil clip). This was found to be concordant by Dr. Frederico Hamman. Pathology results were discussed with the patient's son, Sophia Woods by telephone, per request. The patient's son reported doing well after the biopsies with tenderness at the sites. Post biopsy instructions and care were reviewed and questions were answered. The patient's son was encouraged to call [facility] for any additional concerns. My direct phone number was provided. The patient was referred to [facility] on November 24, 2022 . Pathology results reported by Rene Kocher, RN on 11/19/2022. Electronically Signed   By: Frederico Hamman M.D.    On: 11/19/2022 13:58   Result Date: 11/19/2022 CLINICAL DATA:  82 year old female presenting for ultrasound-guided biopsy of 2 masses in the left breast. EXAM: ULTRASOUND GUIDED LEFT BREAST CORE NEEDLE BIOPSY COMPARISON:  Previous exam(s). PROCEDURE: I met with the patient and we discussed the procedure of ultrasound-guided biopsy, including benefits and alternatives. We discussed the high likelihood of a successful procedure. We discussed the risks of the procedure, including infection, bleeding, tissue injury, clip migration, and inadequate sampling. Informed written consent was given. The usual time-out protocol was performed immediately prior to the procedure. Lesion quadrant: Upper inner quadrant Using sterile technique and 1% Lidocaine as local anesthetic, under direct ultrasound visualization, a  14 gauge spring-loaded device was used to perform biopsy of a mass in the retroareolar left breast using a medial approach. At the conclusion of the procedure a venus shaped tissue marker clip was deployed into the biopsy cavity. Lesion quadrant: Upper inner quadrant Using sterile technique and 1% Lidocaine as local anesthetic, under direct ultrasound visualization, a 14 gauge spring-loaded device was used to perform biopsy of a left breast mass at 12 o'clock, 4 cm from the nipple using an inferomedial approach. At the conclusion of the procedure a coil shaped tissue marker clip was deployed into the biopsy cavity. Follow up 2 view mammogram was performed and dictated separately. IMPRESSION: 1. Ultrasound guided biopsy of a retroareolar left breast mass (venus clip). No apparent complications. 2. Ultrasound-guided biopsy of a left breast mass at 12 o'clock (coil clip). No apparent complications. Electronically Signed: By: Frederico Hamman M.D. On: 11/18/2022 13:37   [place] LT BREAST BX W LOC DEV EA ADD LESION IMG BX SPEC US GUIDE  Addendum Date: 11/19/2022   ADDENDUM REPORT: 11/19/2022 13:58 ADDENDUM: Pathology  revealed GRADE III INVASIVE POORLY DIFFERENTIATED DUCTAL ADENOCARCINOMA WITH FOCAL APOCRINE FEATURES, FOCAL HIGH-GRADE DUCTAL CARCINOMA IN SITU, CRIBRIFORM TYPE WITH APOCRINE FEATURES AND NECROSIS, MICROCALCIFICATIONS PRESENT WITHIN DCIS of the LEFT breast, retroareolar mass, (venus clip). This was found to be concordant by Dr. Frederico Hamman. Pathology revealed GRADE I INVASIVE WELL-DIFFERENTIATED DUCTAL ADENOCARCINOMA of the LEFT breast, 12:00 o'clock, (coil clip). This was found to be concordant by Dr. Frederico Hamman. Pathology results were discussed with the patient's son, Sophia Woods by telephone, per request. The patient's son reported doing well after the biopsies with tenderness at the sites. Post biopsy instructions and care were reviewed and questions were answered. The patient's son was encouraged to call [facility] for any additional concerns. My direct phone number was provided. The patient was [facility] to [facility] on November 24, 2022 . Pathology results reported by Rene Kocher, RN on 11/19/2022. Electronically Signed   By: Frederico Hamman M.D.   On: 11/19/2022 13:58   Result Date: 11/19/2022 CLINICAL DATA:  82 year old female presenting for ultrasound-guided biopsy of 2 masses in the left breast. EXAM: ULTRASOUND GUIDED LEFT BREAST CORE NEEDLE BIOPSY COMPARISON:  Previous exam(s). PROCEDURE: I met with the patient and we discussed the procedure of ultrasound-guided biopsy, including benefits and alternatives. We discussed the high likelihood of a successful procedure. We discussed the risks of the procedure, including infection, bleeding, tissue injury, clip migration, and inadequate sampling. Informed written consent was given. The usual time-out protocol was performed immediately prior to the procedure. Lesion quadrant: Upper inner quadrant Using sterile technique and 1% Lidocaine as local  anesthetic, under direct ultrasound visualization, a 14 gauge spring-loaded device was used to perform biopsy of a mass in the retroareolar left breast using a medial approach. At the conclusion of the procedure a venus shaped tissue marker clip was deployed into the biopsy cavity. Lesion quadrant: Upper inner quadrant Using sterile technique and 1% Lidocaine as local anesthetic, under direct ultrasound visualization, a 14 gauge spring-loaded device was used to perform biopsy of a left breast mass at 12 o'clock, 4 cm from the nipple using an inferomedial approach. At the conclusion of the procedure a coil shaped tissue marker clip was deployed into the biopsy cavity. Follow up 2 view mammogram was performed and dictated separately. IMPRESSION: 1. Ultrasound guided biopsy of a retroareolar left breast mass (  venus clip). No apparent complications. 2. Ultrasound-guided biopsy of a left breast mass at 12 o'clock (coil clip). No apparent complications. Electronically Signed: By: Frederico Hamman M.D. On: 11/18/2022 13:37   MM CLIP PLACEMENT LEFT  Result Date: 11/18/2022 CLINICAL DATA:  Post biopsy mammogram of the left breast for clip placement. EXAM: 3D DIAGNOSTIC LEFT MAMMOGRAM POST ULTRASOUND BIOPSY COMPARISON:  Previous exam(s). FINDINGS: 3D Mammographic images were obtained following ultrasound guided biopsy of 2 left breast masses. The biopsy marking clips are in expected position at the sites of biopsy. IMPRESSION: 1. Appropriate positioning of the venus shaped biopsy marking clip at the site of biopsy in the retroareolar left breast. 2. Appropriate positioning of the coil shaped biopsy marking clip at the site of biopsy in the superior left breast. Final Assessment: Post Procedure Mammograms for Marker Placement Electronically Signed   By: Frederico Hamman M.D.   On: 11/18/2022 13:55   MM 3D DIAGNOSTIC MAMMOGRAM BILATERAL BREAST  Result Date: 11/10/2022 CLINICAL DATA:  82 year old female with palpable  LEFT breast mass. New baseline mammogram. EXAM: DIGITAL DIAGNOSTIC BILATERAL MAMMOGRAM WITH TOMOSYNTHESIS AND CAD; ULTRASOUND LEFT BREAST LIMITED TECHNIQUE: Bilateral digital diagnostic mammography and breast tomosynthesis was performed. The images were evaluated with computer-aided detection. ; Targeted ultrasound examination of the left breast was performed. COMPARISON:  None available. ACR Breast Density Category c: The breasts are heterogeneously dense, which may obscure small masses. FINDINGS: Full field views of both breast and spot compression and magnification views of the LEFT breast are performed. A large irregular mass within the anterior LEFT breast identified. Associated with and extending beyond the mass (primarily posteriorly and medially) are pleomorphic calcifications that involve the central, UPPER and INNER LEFT breast spanning a distance of 5 cm. A separate 0.8 cm irregular mass is identified within the UPPER LEFT breast. On physical exam, a large firm mass within the UPPER LEFT breast is identified. Targeted ultrasound is performed, showing the following: A 3.5 x 3.5 x 5.1 cm irregular mass in the RETROAREOLAR/anterior UPPER LEFT breast extending to the skin. A 0.8 x 0.7 x 0.5 cm irregular hypoechoic mass at the 12 o'clock position of the LEFT breast 4 cm from the nipple. A 0.7 x 0.6 x 0.8 cm irregular hypoechoic mass immediately adjacent to the large irregular mass at the 9 o'clock position of the LEFT breast 2 cm from the nipple. No abnormal LEFT axillary lymph nodes are identified. IMPRESSION: 1. Highly suspicious 5.1 cm RETROAREOLAR/UPPER LEFT breast mass. Tissue sampling is recommended. 2. Highly suspicious 0.8 cm UPPER LEFT breast mass at the 12 o'clock position. Tissue sampling is recommended. 3. Highly suspicious 0.8 cm mass within the INNER LEFT breast. Tissue sampling does not need performed as this is immediately adjacent to the 5.1 cm LEFT breast mass. 4. No abnormal appearing LEFT  axillary lymph nodes. 5. Highly suspicious calcifications involving and adjacent to the highly suspicious 5.1 cm LEFT breast mass. Stereotactic guided biopsy can be performed after ultrasound-guided biopsies if results would alter treatment. 6. No suspicious RIGHT breast findings. RECOMMENDATION: Ultrasound-guided biopsies of 5.1 cm RETROAREOLAR/UPPER LEFT breast mass and 0.8 cm UPPER LEFT breast mass at the 12 o'clock position, which will be scheduled. I have discussed the findings and recommendations with the patient. If applicable, a reminder letter will be sent to the patient regarding the next appointment. BI-RADS CATEGORY  5: Highly suggestive of malignancy. Electronically Signed   By: Harmon Pier M.D.   On: 11/10/2022 16:12  [place] LIMITED ULTRASOUND INCLUDING  AXILLA LEFT BREAST   Result Date: 11/10/2022 CLINICAL DATA:  82 year old female with palpable LEFT breast mass. New baseline mammogram. EXAM: DIGITAL DIAGNOSTIC BILATERAL MAMMOGRAM WITH TOMOSYNTHESIS AND CAD; ULTRASOUND LEFT BREAST LIMITED TECHNIQUE: Bilateral digital diagnostic mammography and breast tomosynthesis was performed. The images were evaluated with computer-aided detection. ; Targeted ultrasound examination of the left breast was performed. COMPARISON:  None available. ACR Breast Density Category c: The breasts are heterogeneously dense, which may obscure small masses. FINDINGS: Full field views of both breast and spot compression and magnification views of the LEFT breast are performed. A large irregular mass within the anterior LEFT breast identified. Associated with and extending beyond the mass (primarily posteriorly and medially) are pleomorphic calcifications that involve the central, UPPER and INNER LEFT breast spanning a distance of 5 cm. A separate 0.8 cm irregular mass is identified within the UPPER LEFT breast. On physical exam, a large firm mass within the UPPER LEFT breast is identified. Targeted ultrasound is performed, showing  the following: A 3.5 x 3.5 x 5.1 cm irregular mass in the RETROAREOLAR/anterior UPPER LEFT breast extending to the skin. A 0.8 x 0.7 x 0.5 cm irregular hypoechoic mass at the 12 o'clock position of the LEFT breast 4 cm from the nipple. A 0.7 x 0.6 x 0.8 cm irregular hypoechoic mass immediately adjacent to the large irregular mass at the 9 o'clock position of the LEFT breast 2 cm from the nipple. No abnormal LEFT axillary lymph nodes are identified. IMPRESSION: 1. Highly suspicious 5.1 cm RETROAREOLAR/UPPER LEFT breast mass. Tissue sampling is recommended. 2. Highly suspicious 0.8 cm UPPER LEFT breast mass at the 12 o'clock position. Tissue sampling is recommended. 3. Highly suspicious 0.8 cm mass within the INNER LEFT breast. Tissue sampling does not need performed as this is immediately adjacent to the 5.1 cm LEFT breast mass. 4. No abnormal appearing LEFT axillary lymph nodes. 5. Highly suspicious calcifications involving and adjacent to the highly suspicious 5.1 cm LEFT breast mass. Stereotactic guided biopsy can be performed after ultrasound-guided biopsies if results would alter treatment. 6. No suspicious RIGHT breast findings. RECOMMENDATION: Ultrasound-guided biopsies of 5.1 cm RETROAREOLAR/UPPER LEFT breast mass and 0.8 cm UPPER LEFT breast mass at the 12 o'clock position, which will be scheduled. I have discussed the findings and recommendations with the patient. If applicable, a reminder letter will be sent to the patient regarding the next appointment. BI-RADS CATEGORY  5: Highly suggestive of malignancy. Electronically Signed   By: Harmon Pier M.D.   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 The  consensus from tumor board was for the patient to receive a mastectomy and adjuvant anti-hormone therapy. If final pathology reveals a tumor significantly larger than 5 cm or positive margins, we will consider radiation. Considering her current medical issues, a tumor approximately 5 cm in size is not, by itself, a sufficient reason to opt for radiation therapy. However, we are willing to discuss any result with the treatment team. We will await surgical pathology to make a final treatment decision on radiation, but we are hopeful to avoid it in her case. Positive nodes could also be a reason to consider RT, but  Dr. Dwain Sarna does not plan on excising nodes.  We discussed the risks, benefits, and side effects of radiotherapy. We explained that we would only give radiotherapy if she is at high risk for disease recurrence. We spoke about acute effects including skin irritation and fatigue as well as much less common late effects including internal organ injury or irritation. We spoke about the latest technology that is used to minimize the risk of late effects for patients undergoing radiotherapy to the chest wall. No guarantees of treatment were given. The patient would like to avoid radiation if possible, but is willing to follow our recommendations.   On date of service, in total, I spent 60 minutes on this encounter. Patient was seen in person. Note signed after encounter date; minutes pertain to date of service, only.    __________________________________________   Joyice Faster, PA-C    Lonie Peak, MD  This document serves as a record of services personally performed by Lonie Peak, MD. It was created on her behalf by Neena Rhymes, a trained medical scribe. The creation of this record is based on the scribe's personal observations and the provider's statements to them. This document has been checked and approved by the attending provider.

## 2022-11-24 ENCOUNTER — Ambulatory Visit: Payer: Medicare Other | Admitting: Physical Therapy

## 2022-11-24 ENCOUNTER — Encounter: Payer: Self-pay | Admitting: *Deleted

## 2022-11-24 ENCOUNTER — Other Ambulatory Visit: Payer: Self-pay

## 2022-11-24 ENCOUNTER — Ambulatory Visit
Admission: RE | Admit: 2022-11-24 | Discharge: 2022-11-24 | Disposition: A | Discharge status: Home / Self Care | Payer: Medicare Other | Source: Ambulatory Visit | Attending: Radiation Oncology | Admitting: Radiation Oncology

## 2022-11-24 ENCOUNTER — Inpatient Hospital Stay: Payer: Medicare Other | Attending: Hematology and Oncology | Admitting: Hematology and Oncology

## 2022-11-24 ENCOUNTER — Other Ambulatory Visit: Payer: Self-pay | Admitting: General Surgery

## 2022-11-24 ENCOUNTER — Inpatient Hospital Stay (HOSPITAL_BASED_OUTPATIENT_CLINIC_OR_DEPARTMENT_OTHER): Payer: Medicare Other | Admitting: Genetic Counselor

## 2022-11-24 ENCOUNTER — Inpatient Hospital Stay: Payer: Medicare Other

## 2022-11-24 VITALS — BP 138/30 | HR 57 | Temp 97.8°F | Resp 18 | Ht 64.0 in | Wt 149.0 lb

## 2022-11-24 DIAGNOSIS — Z170 Estrogen receptor positive status [ER+]: Secondary | ICD-10-CM | POA: Insufficient documentation

## 2022-11-24 DIAGNOSIS — Z803 Family history of malignant neoplasm of breast: Secondary | ICD-10-CM | POA: Diagnosis not present

## 2022-11-24 DIAGNOSIS — C50412 Malignant neoplasm of upper-outer quadrant of left female breast: Secondary | ICD-10-CM | POA: Diagnosis not present

## 2022-11-24 DIAGNOSIS — C50112 Malignant neoplasm of central portion of left female breast: Secondary | ICD-10-CM | POA: Diagnosis not present

## 2022-11-24 DIAGNOSIS — Z87891 Personal history of nicotine dependence: Secondary | ICD-10-CM | POA: Insufficient documentation

## 2022-11-24 DIAGNOSIS — Z8042 Family history of malignant neoplasm of prostate: Secondary | ICD-10-CM

## 2022-11-24 DIAGNOSIS — Z800 Family history of malignant neoplasm of digestive organs: Secondary | ICD-10-CM

## 2022-11-24 LAB — CMP (CANCER CENTER ONLY)
ALT: 13 U/L (ref 0–44)
AST: 15 U/L (ref 15–41)
Albumin: 3.8 g/dL (ref 3.5–5.0)
Alkaline Phosphatase: 53 U/L (ref 38–126)
Anion gap: 5 (ref 5–15)
BUN: 60 mg/dL — ABNORMAL HIGH (ref 8–23)
CO2: 25 mmol/L (ref 22–32)
Calcium: 9.3 mg/dL (ref 8.9–10.3)
Chloride: 113 mmol/L — ABNORMAL HIGH (ref 98–111)
Creatinine: 1.44 mg/dL — ABNORMAL HIGH (ref 0.44–1.00)
GFR, Estimated: 36 mL/min — ABNORMAL LOW (ref >60)
Glucose, Bld: 90 mg/dL (ref 70–99)
Potassium: 4.7 mmol/L (ref 3.5–5.1)
Sodium: 143 mmol/L (ref 135–145)
Total Bilirubin: 0.4 mg/dL (ref 0.3–1.2)
Total Protein: 6.5 g/dL (ref 6.5–8.1)

## 2022-11-24 LAB — CBC WITH DIFFERENTIAL (CANCER CENTER ONLY)
Abs Immature Granulocytes: 0.03 10*3/uL (ref 0.00–0.07)
Basophils Absolute: 0.1 10*3/uL (ref 0.0–0.1)
Basophils Relative: 1 %
Eosinophils Absolute: 0.5 10*3/uL (ref 0.0–0.5)
Eosinophils Relative: 5 %
HCT: 32.9 % — ABNORMAL LOW (ref 36.0–46.0)
Hemoglobin: 10.4 g/dL — ABNORMAL LOW (ref 12.0–15.0)
Immature Granulocytes: 0 %
Lymphocytes Relative: 53 %
Lymphs Abs: 5.1 10*3/uL — ABNORMAL HIGH (ref 0.7–4.0)
MCH: 30.8 pg (ref 26.0–34.0)
MCHC: 31.6 g/dL (ref 30.0–36.0)
MCV: 97.3 fL (ref 80.0–100.0)
Monocytes Absolute: 0.8 10*3/uL (ref 0.1–1.0)
Monocytes Relative: 9 %
Neutro Abs: 3 10*3/uL (ref 1.7–7.7)
Neutrophils Relative %: 32 %
Platelet Count: 221 10*3/uL (ref 150–400)
RBC: 3.38 MIL/uL — ABNORMAL LOW (ref 3.87–5.11)
RDW: 12.9 % (ref 11.5–15.5)
Smear Review: NORMAL
WBC Count: 9.6 10*3/uL (ref 4.0–10.5)
nRBC: 0 % (ref 0.0–0.2)

## 2022-11-24 LAB — GENETIC SCREENING ORDER

## 2022-11-24 MED ORDER — TRANEXAMIC ACID 1000 MG/10ML IV SOLN: 3000.0000 mg | Freq: Once | INTRAVENOUS | Status: AC

## 2022-11-24 NOTE — Progress Notes (Signed)
[facility] CONSULT NOTE  Patient Care Team: Merri Brunette, MD as PCP - General (Family Medicine) Pershing Proud, RN as Oncology Nurse Navigator Donnelly Angelica, RN as Oncology Nurse Navigator  CHIEF COMPLAINTS/PURPOSE OF CONSULTATION:  Newly diagnosed breast cancer  HISTORY OF PRESENTING ILLNESS:  Sophia Woods 82 y.o. female is here because of recent diagnosis of left breast cancer.  Patient felt a lump in the left breast which was evaluated by mammogram and ultrasound and biopsies were performed.  There were 2 separate components 1 larger 5.1 cm retroareolar mass biopsy was grade 3 IDC with high-grade DCIS that was ER positive PR negative HER2 negative with a Ki67 of 40%.  The smaller mass measures 0.8 cm in length biopsy revealed grade 1 IDC that is ER 100% PR 0% Ki67 10%, HER2 negative.  Patient was presented this morning in the multidisciplinary tumor board and she is here today accompanied by her granddaughter and her son to discuss treatment plan. Patient lives all by herself on the weekends but on the weekdays her son lives with her.  I reviewed her records extensively and collaborated the history with the patient.  SUMMARY OF ONCOLOGIC HISTORY: Oncology History  Malignant neoplasm of upper-outer quadrant of left female breast (HCC)  11/18/2022 Initial Diagnosis   Palpable left breast mass retroareolar region by ultrasound 2 masses were identified.  5.1 cm retroareolar and a 0.8 cm superior to this.  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%, PR 0%, Ki67 40%, HER2 negative; biopsy smaller mass: Grade 1 IDC ER 100%, PR 0%, HER2 negative, Ki-67 10%, axilla negative   11/24/2022 Cancer Staging   Staging form: Breast, AJCC 8th Edition - Clinical stage from 11/24/2022: Stage IIB (cT3, cN0, cM0, G1, ER+, PR-, HER2-) - Signed by [name], MD on 11/24/2022 Stage prefix: Initial diagnosis Histologic grading system: 3 grade  system Laterality: Left Staged by: Pathologist and managing physician Stage used in treatment planning: Yes National guidelines used in treatment planning: Yes Type of national guideline used in treatment planning: NCCN      MEDICAL HISTORY:  Past Medical History:  Diagnosis Date   Atrial fibrillation (HCC)    Hypercholesteremia    Hypothyroidism    Obesity     SURGICAL HISTORY: Past Surgical History:  Procedure Laterality Date   BREAST BIOPSY Left 11/18/2022   [place] LT BREAST BX W LOC DEV 1ST LESION IMG BX SPEC US GUIDE 11/18/2022 [facility]   BREAST BIOPSY Left 11/18/2022   [place] LT BREAST BX W LOC DEV EA ADD LESION IMG BX SPEC US GUIDE 11/18/2022 [facility]   CHOLECYSTECTOMY     eye irritation/pterygium removed     HERNIA REPAIR     TOE SURGERY      SOCIAL HISTORY: Social History   Socioeconomic History   Marital status: Divorced    Spouse name: Not on file   Number of children: Not on file   Years of education: Not on file   Highest education level: Not on file  Occupational History   Not on file  Tobacco Use   Smoking status: Former    Current packs/day: 0.00    Types: Cigarettes    Quit date: 08/31/1988    Years since quitting: 34.2   Smokeless tobacco: Not on file  Substance and Sexual Activity   Alcohol use: Not on file   Drug use: Not on file   Sexual activity: Not on  file  Other Topics Concern   Not on file  Social History Narrative   Not on file   Social Determinants of Health   Financial Resource Strain: Not on file  Food Insecurity: Not on file  Transportation Needs: Not on file  Physical Activity: Not on file  Stress: Not on file  Social Connections: Unknown (09/22/2021)   Received [facility]   Social Network    Social Network: Not on file  Intimate Partner Violence: Unknown (08/14/2021)   Received from [facility]   HITS    Physically Hurt: Not on file    Insult or Talk Down To: Not on file    Threaten Physical  Harm: Not on file    Scream or Curse: Not on file    FAMILY HISTORY: Family History  Problem Relation Age of Onset   Hypertension Mother    Cancer - Prostate Father    Breast cancer Sister 32    ALLERGIES:  is allergic to penicillins and bactrim [sulfamethoxazole-trimethoprim].  MEDICATIONS:  Current Outpatient Medications  Medication Sig Dispense Refill   aspirin 81 MG tablet Take 81 mg by mouth daily.     atorvastatin (LIPITOR) 40 MG tablet Take 20 mg by mouth daily.     Calcium Carbonate-Vitamin D (CALCIUM 500+D PO) Take 500 mg by mouth daily. Take one tab     diltiazem (TIAZAC) 120 MG 24 hr capsule Take 1 capsule (120 mg total) by mouth daily. 30 capsule 9   doxycycline (VIBRAMYCIN) 100 MG capsule Take 1 capsule (100 mg total) by mouth 2 (two) times daily. One po bid x 7 days 14 capsule 0   furosemide (LASIX) 40 MG tablet Take 40 mg by mouth daily. Take 1/2 tab     levothyroxine (SYNTHROID, LEVOTHROID) 50 MCG tablet Take 50 mcg by mouth daily before breakfast.     losartan (COZAAR) 50 MG tablet Take 50 mg by mouth daily. One tab     metoprolol tartrate (LOPRESSOR) 25 MG tablet Take 25 mg by mouth 2 (two) times daily.     No current facility-administered medications for this visit.   Facility-Administered Medications Ordered in Other Visits  Medication Dose Route Frequency Provider Last Rate Last Admin   tranexamic acid (CYKLOKAPRON) 3,000 mg in sodium chloride 0.9 % 100 mL Topical Application  3,000 mg Topical Once Emelia Loron, MD        REVIEW OF SYSTEMS:   Constitutional: Denies fevers, chills or abnormal night sweats   All other systems were reviewed with the patient and are negative.  PHYSICAL EXAMINATION: ECOG PERFORMANCE STATUS: 3 - Symptomatic, >50% confined to bed  Vitals:   11/24/22 1259 11/24/22 1300  BP: (!) 148/56 (!) 138/30  Pulse: (!) 57   Resp: 18   Temp: 97.8 F (36.6 C)   SpO2: 92%    Filed Weights   11/24/22 1259  Weight: 149 lb (67.6  kg)    GENERAL:alert, no distress and comfortable    LABORATORY DATA:  I have reviewed the data as listed Lab Results  Component Value Date   WBC 9.6 11/24/2022   HGB 10.4 (L) 11/24/2022   HCT 32.9 (L) 11/24/2022   MCV 97.3 11/24/2022   PLT 221 11/24/2022   Lab Results  Component Value Date   NA 143 11/24/2022   K 4.7 11/24/2022   CL 113 (H) 11/24/2022   CO2 25 11/24/2022    RADIOGRAPHIC STUDIES: I have personally reviewed the radiological reports and agreed with the  findings in the report.  ASSESSMENT AND PLAN:  Malignant neoplasm of upper-outer quadrant of left female breast (HCC) 11/18/2022:Palpable left breast mass retroareolar region by ultrasound 2 masses were identified.  5.1 cm retroareolar and a 0.8 cm superior to this.  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 After reviewing the pathology in detail, we proceeded to discuss the different treatment options between surgery and antiestrogen therapies.  Treatment plan: Mastectomy (because of her limitation of range of motion she is not a candidate for sentinel lymph node surgery or radiation) Optional antiestrogen therapy with anastrozole  Return to clinic 2 weeks after surgery to discuss antiestrogen treatment plan.  Patient already has severe stiffness and joint pains therefore we are slightly leery about prescribing antiestrogen therapy.   All questions were answered. The patient knows to call the clinic with any problems, questions or concerns.    Tamsen Meek, MD 11/24/22

## 2022-11-24 NOTE — Assessment & Plan Note (Signed)
11/18/2022:Palpable left breast mass retroareolar region by ultrasound 2 masses were identified.  5.1 cm retroareolar and a 0.8 cm superior to this.  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 After reviewing the pathology in detail, we proceeded to discuss the different treatment options between surgery and antiestrogen therapies.  Treatment plan: Mastectomy (because of her limitation of range of motion she is not a candidate for sentinel lymph node surgery or radiation) Optional antiestrogen therapy with anastrozole  Return to clinic 2 weeks after surgery to discuss antiestrogen treatment plan.  Patient already has severe stiffness and joint pains therefore we are slightly leery about prescribing antiestrogen therapy.

## 2022-11-25 ENCOUNTER — Encounter: Payer: Self-pay | Admitting: General Practice

## 2022-11-25 ENCOUNTER — Encounter: Payer: Self-pay | Admitting: Genetic Counselor

## 2022-11-25 DIAGNOSIS — Z8042 Family history of malignant neoplasm of prostate: Secondary | ICD-10-CM | POA: Insufficient documentation

## 2022-11-25 DIAGNOSIS — Z803 Family history of malignant neoplasm of breast: Secondary | ICD-10-CM | POA: Insufficient documentation

## 2022-11-25 DIAGNOSIS — Z800 Family history of malignant neoplasm of digestive organs: Secondary | ICD-10-CM | POA: Insufficient documentation

## 2022-11-25 NOTE — Progress Notes (Signed)
REFERRING PROVIDER: Merri Brunette, MD [phone] [place],  [place] [address]  PRIMARY PROVIDER:  Merri Brunette, MD  PRIMARY REASON FOR VISIT:  1. Family history of breast cancer   2. Family history of prostate cancer   3. Family history of stomach cancer   4. Malignant neoplasm of upper-outer quadrant of left breast in female, estrogen receptor positive (HCC)      HISTORY OF PRESENT ILLNESS:   Sophia Woods, a 82 y.o. female, was seen for a [facility] cancer genetics consultation at the request of Dr. Katrinka Blazing due to a personal and family history of cancer.  Sophia Woods presents to clinic today to discuss the possibility of a hereditary predisposition to cancer, genetic testing, and to further clarify her future cancer risks, as well as potential cancer risks for family members.   In July 2024, at the age of 63, Sophia Woods was diagnosed with cancer of the left breast.     CANCER HISTORY:  Oncology History  Malignant neoplasm of upper-outer quadrant of left female breast (HCC)  11/18/2022 Initial Diagnosis   Palpable left breast mass retroareolar region by ultrasound 2 masses were identified.  5.1 cm retroareolar and a 0.8 cm superior to this.  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%, PR 0%, Ki67 40%, HER2 negative; biopsy smaller mass: Grade 1 IDC ER 100%, PR 0%, HER2 negative, Ki-67 10%, axilla negative   11/24/2022 Cancer Staging   Staging form: Breast, AJCC 8th Edition - Clinical stage from 11/24/2022: Stage IIB (cT3, cN0, cM0, G1, ER+, PR-, HER2-) - Signed by [name], MD on 11/24/2022 Stage prefix: Initial diagnosis Histologic grading system: 3 grade system Laterality: Left Staged by: Pathologist and managing physician Stage used in treatment planning: Yes National guidelines used in treatment planning: Yes Type of national guideline used in treatment planning: NCCN      Past Medical History:  Diagnosis Date   Atrial  fibrillation (HCC)    Family history of breast cancer    Family history of prostate cancer    Family history of stomach cancer    Hypercholesteremia    Hypothyroidism    Obesity     Past Surgical History:  Procedure Laterality Date   BREAST BIOPSY Left 11/18/2022   [place] LT BREAST BX W LOC DEV 1ST LESION IMG BX SPEC US GUIDE 11/18/2022 [facility]   BREAST BIOPSY Left 11/18/2022   [place] LT BREAST BX W LOC DEV EA ADD LESION IMG BX SPEC US GUIDE 11/18/2022 [facility]   CHOLECYSTECTOMY     eye irritation/pterygium removed     HERNIA REPAIR     TOE SURGERY      Social History   Socioeconomic History   Marital status: Divorced    Spouse name: Not on file   Number of children: Not on file   Years of education: Not on file   Highest education level: Not on file  Occupational History   Not on file  Tobacco Use   Smoking status: Former    Current packs/day: 0.00    Types: Cigarettes    Quit date: 08/31/1988    Years since quitting: 34.2   Smokeless tobacco: Not on file  Substance and Sexual Activity   Alcohol use: Not on file   Drug use: Not on file   Sexual activity: Not on file  Other Topics Concern   Not on file  Social History Narrative   Not on  file   Social Determinants of Health   Financial Resource Strain: Not on file  Food Insecurity: Not on file  Transportation Needs: Not on file  Physical Activity: Not on file  Stress: Not on file  Social Connections: Unknown (09/22/2021)   [facility]   Social Network    Social Network: Not on file     FAMILY HISTORY:  We obtained a detailed, 4-generation family history.  Significant diagnoses are listed below: Family History  Problem Relation Age of Onset   Hypertension Mother    Cancer - Prostate Father        dx 53s   Breast cancer Sister 38   Stomach cancer Maternal Aunt    Breast cancer Maternal Aunt    Cancer Maternal Aunt        NOS   Stomach cancer Maternal Uncle    Lung cancer  Maternal Uncle    Bone cancer Maternal Uncle    Cancer Maternal Uncle        started in his ear   Stomach cancer Paternal Grandfather    Prostate cancer Son 33   Breast cancer Cousin        mat first cousin     The patient has one son who was diagnosed with prostate cancer at 87.  She has two sisters, one who was diagnosed with breast cancer at 35.  Both parents are deceased.    The patient's mother died and did not have cancer.  She had four sisters and four brothers.  One sister had stomach cancer, another had breast cancer, a third had an unknown cancer and the fourth did not have cancer but had a daughter with breast cancer.  One brother had lung cancer, another had bone, a third had a head/neck cancer and the fourth had stomach cancer.  The maternal grandfather died of stomach cancer.  Sophia Woods is unaware of previous family history of genetic testing for hereditary cancer risks. There is no reported Ashkenazi Jewish ancestry. There is no known consanguinity.  GENETIC COUNSELING ASSESSMENT: Sophia Woods is a 82 y.o. female with a personal and family history of cancer which is somewhat suggestive of a hereditary cancer syndrome and predisposition to cancer given the combination of cancer in the family. We, therefore, discussed and recommended the following at today's visit.   DISCUSSION: We discussed that, in general, most cancer is not inherited in families, but instead is sporadic or familial. Sporadic cancers occur by chance and typically happen at older ages (>50 years) as this type of cancer is caused by genetic changes acquired during an individual's lifetime. Some families have more cancers than would be expected by chance; however, the ages or types of cancer are not consistent with a known genetic mutation or known genetic mutations have been ruled out. This type of familial cancer is thought to be due to a combination of multiple genetic, environmental, hormonal, and lifestyle factors.  While this combination of factors likely increases the risk of cancer, the exact source of this risk is not currently identifiable or testable.  We discussed that 5 - 10% of breast cancer is hereditary, with most cases associated with BRCA mutations.  There are other genes that can be associated with hereditary breast cancer syndromes.  These include ATM, CHEK2 and PALB2.  We discussed that testing is beneficial for several reasons including knowing how to follow individuals after completing their treatment, identifying whether potential treatment options such as PARP inhibitors would be  beneficial, and understand if other family members could be at risk for cancer and allow them to undergo genetic testing.   We reviewed the characteristics, features and inheritance patterns of hereditary cancer syndromes. We also discussed genetic testing, including the appropriate family members to test, the process of testing, insurance coverage and turn-around-time for results. We discussed the implications of a negative, positive, carrier and/or variant of uncertain significant result. Sophia Woods  was offered a common hereditary cancer panel (47 genes) and an expanded pan-cancer panel (77 genes). Sophia Woods was informed of the benefits and limitations of each panel, including that expanded pan-cancer panels contain genes that do not have clear management guidelines at this point in time.  We also discussed that as the number of genes included on a panel increases, the chances of variants of uncertain significance increases. Sophia Woods decided to pursue genetic testing for the Multi-cancer gene panel.   The Multi-Cancer + RNA Panel offered by [facility] includes sequencing and/or deletion/duplication analysis of the following 70 genes:  AIP*, ALK, APC*, ATM*, AXIN2*, BAP1*, BARD1*, BLM*, BMPR1A*, BRCA1*, BRCA2*, BRIP1*, CDC73*, CDH1*, CDK4, CDKN1B*, CDKN2A, CHEK2*, CTNNA1*, DICER1*, EPCAM (del/dup only), EGFR, FH*, FLCN*, GREM1  (promoter dup only), HOXB13, KIT, LZTR1, MAX*, MBD4, MEN1*, MET, MITF, MLH1*, MSH2*, MSH3*, MSH6*, MUTYH*, NF1*, NF2*, NTHL1*, PALB2*, PDGFRA, PMS2*, POLD1*, POLE*, POT1*, PRKAR1A*, PTCH1*, PTEN*, RAD51C*, RAD51D*, RB1*, RET, SDHA* (sequencing only), SDHAF2*, SDHB*, SDHC*, SDHD*, SMAD4*, SMARCA4*, SMARCB1*, SMARCE1*, STK11*, SUFU*, TMEM127*, TP53*, TSC1*, TSC2*, VHL*. RNA analysis is performed for * genes.   Based on Sophia Woods's personal and family history of cancer, she meets medical criteria for genetic testing. Despite that she meets criteria, she may still have an out of pocket cost. We discussed that if her out of pocket cost for testing is over $100, the laboratory will call and confirm whether she wants to proceed with testing.  If the out of pocket cost of testing is less than $100 she will be billed by the genetic testing laboratory.   PLAN: Despite our recommendation, Sophia Woods did not wish to pursue genetic testing at today's visit. We understand this decision and remain available to coordinate genetic testing at any time in the future. We, therefore, recommend Sophia Woods continue to follow the cancer screening guidelines given by her primary healthcare provider.  Based on Sophia Woods' family history, we recommended her sister, who was diagnosed with breast cancer at age 21, and her son, who was diagnosed with prostate cancer at 53, have genetic counseling and testing. Sophia Woods will let us know if we can be of any assistance in coordinating genetic counseling and/or testing for this family member.   Lastly, we encouraged Sophia Woods to remain in contact with cancer genetics annually so that we can continuously update the family history and inform her of any changes in cancer genetics and testing that may be of benefit for this family.   Sophia Woods questions were answered to her satisfaction today. Our contact information was provided should additional questions or concerns arise. Thank you for the  referral and allowing [place] to share in the care of your patient.   Abas Leicht P. Lowell Guitar, MS, [facility] Licensed, Patent attorney [email or web address] phone: [phone]  The patient was seen for a total of 15 minutes in face-to-face genetic counseling.  The patient brought her son and granddaughter. Drs. Meliton Rattan, and/or [place] were available for questions, if needed..    _______________________________________________________________________ For Office Staff:  Number of people involved in session:  3 Was an Intern/ student involved with case: no

## 2022-11-25 NOTE — Progress Notes (Signed)
[facility] Multidisciplinary Clinic Spiritual Care Note  Left voicemail for Sophia Woods following Breast Multidisciplinary Clinic to introduce Support Center team/resources.  she completed SDOH screening; results follow below.    SDOH Screenings   Food Insecurity: No Food Insecurity (11/25/2022)  Housing: Low Risk  (11/25/2022)  Transportation Needs: No Transportation Needs (11/25/2022)  Utilities: Not At Risk (11/25/2022)  Depression (PHQ2-9): Low Risk  (11/25/2022)  Social Connections: Unknown (09/22/2021)   Received from [facility]  Tobacco Use: Medium Risk (11/24/2022)   Received from [facility]    Follow up needed: Yes.  Plan to follow up by phone if needed.   [address] [place], [place], [facility] Pager [phone] Voicemail [phone]

## 2022-11-26 ENCOUNTER — Encounter: Payer: Self-pay | Admitting: Radiation Oncology

## 2022-11-30 ENCOUNTER — Telehealth: Payer: Self-pay | Admitting: *Deleted

## 2022-11-30 ENCOUNTER — Encounter: Payer: Self-pay | Admitting: *Deleted

## 2022-11-30 NOTE — Telephone Encounter (Signed)
Left message for a return phone call to follow up from [facility] 7/17 and assess navigation needs.

## 2022-12-02 NOTE — Pre-Procedure Instructions (Signed)
Sophia Woods  12/02/2022       Your procedure is scheduled on Thursday, August 1  Report to [facility] Admitting at 5:30 AM.  Call this number if you have problems the morning of surgery: [phone]- this is the Pre- OP desk  If you have questions Monday - Friday 8:00- 4:30 PM call [phone]- this is the PAT desk, ask for any nurse  If you experience any cold or flu symptoms such as cough, fever, chills, shortness of breath, etc. between now and your scheduled surgery, please notify us at the above number.   Remember:  Do not eat after midnight.  You may drink clear liquids until 4:30 AM .   Clear liquids allowed are:                     Water, Juice (No red color; non-citric and without pulp; diabetics please choose diet or no sugar options), Carbonated beverages (diabetics please choose diet or no sugar options), Clear Tea (No creamer, milk, or cream, including half & half and powdered creamer), Black Coffee Only (No creamer, milk or cream, including half & half and powdered creamer), Plain Jell-O Only (No red color; diabetics please choose no sugar options), Clear Sports drink (No red color; diabetics please choose diet or no sugar options), and Plain Popsicles Only (No red color; diabetics please choose no sugar options)  Drink the Pre- Surgery Ensure between 4:00 AM and 4:30 AM- nothing after this.  Enhanced Recovery after Surgery for Orthopedics Enhanced Recovery after Surgery is a protocol used to improve the stress on your body and your recovery after surgery.  Patient Instructions  The day of surgery (if you do NOT have diabetes):  Drink ONE (1) Pre-Surgery Clear Ensure by 4:30 am the morning of surgery  This drink was given to you during your hospital  pre-op appointment visit. Nothing else to drink after completing the  Pre-Surgery Clear Ensure.        Take these medicines the morning of surgery with A SIP OF WATER: diltiazem (DILACOR XR)   levothyroxine (SYNTHROID)  metoprolol tartrate (LOPRESSOR)    If needed:  acetaminophen (TYLENOL)  traMADol (ULTRAM)   Follow your surgeon's instructions regarding Aspirin.  1 Week prior to surgery STOP taking Aspirin Products (Goody Powder, Excedrin Migraine), Ibuprofen (Advil), Naproxen (Aleve), Vitamins and Herbal Products (ie Fish Oil).    Special instructions:    [name]- Preparing For Surgery  Before surgery, you can play an important role. Because skin is not sterile, your skin needs to be as free of germs as possible. You can reduce the number of germs on your skin by washing with CHG (chlorahexidine gluconate) Soap before surgery.  CHG is an antiseptic cleaner which kills germs and bonds with the skin to continue killing germs even after washing.    Oral Hygiene is also important to reduce your risk of infection.  Remember - BRUSH YOUR TEETH THE MORNING OF SURGERY WITH YOUR REGULAR TOOTHPASTE  Please do not use if you have an allergy to CHG or antibacterial soaps. If your skin becomes reddened/irritated stop using the CHG.  Do not shave (including legs and underarms) for at least 48 hours prior to first CHG shower. It is OK to shave your face.  Please follow these instructions carefully.   Shower the NIGHT BEFORE SURGERY and the MORNING OF SURGERY with CHG.   If you chose to wash your hair, wash your hair  first as usual with your normal shampoo.  After you shampoo, wash your face and private area with the soap you use at home, then rinse your hair and body thoroughly to remove the shampoo and soap.  Use CHG as you would any other liquid soap. You can apply CHG directly to the skin and wash gently with a scrungie or a clean washcloth.   Apply the CHG Soap to your body ONLY FROM THE NECK DOWN.  Do not use on open wounds or open sores. Avoid contact with your eyes, ears, mouth and genitals (private parts).   Wash thoroughly, paying special attention to the area where  your surgery will be performed.  Thoroughly rinse your body with warm water from the neck down.  DO NOT shower/wash with your normal soap after using and rinsing off the CHG Soap.  Pat yourself dry with a CLEAN TOWEL.  Wear CLEAN PAJAMAS to bed the night before surgery, wear comfortable clothes the morning of surgery  Place CLEAN SHEETS on your bed the night of your first shower and DO NOT SLEEP WITH PETS.  Day of Surgery: Shower as instructed above. Do not apply any deodorants/lotions, powders or colognes.  Please wear clean clothes to the hospital/surgery center.   Remember to brush your teeth WITH YOUR REGULAR TOOTHPASTE.  Do not wear jewelry, make-up or nail polish.  Do not shave 48 hours prior to surgery.  Men may shave face and neck.  Do not bring valuables to the hospital.  [facility] is not responsible for any belongings or valuables.  Contacts, dentures or bridgework may not be worn into surgery.  Leave your suitcase in the car.  After surgery it may be brought to your room.  For patients admitted to the hospital, discharge time will be determined by your treatment team.  Patients discharged the day of surgery will not be allowed to drive home.   Please read over the fact sheets that you were given.

## 2022-12-02 NOTE — Progress Notes (Signed)
Pt chart reviewed with Dr. Richardson Landry to evaluate if pt would be ok for her procedure at [facility]. After reviewing pts cardiac and other health history Dr. Richardson Landry determined that this would need cardiac clearance before she could have surgery at any location. Central scheduling has been made aware.

## 2022-12-03 ENCOUNTER — Other Ambulatory Visit: Payer: Self-pay

## 2022-12-03 ENCOUNTER — Encounter (HOSPITAL_COMMUNITY)
Admission: RE | Admit: 2022-12-03 | Discharge: 2022-12-03 | Disposition: A | Discharge status: Home / Self Care | Payer: Medicare Other | Source: Ambulatory Visit | Attending: General Surgery | Admitting: General Surgery

## 2022-12-03 ENCOUNTER — Ambulatory Visit (HOSPITAL_COMMUNITY)
Admission: RE | Admit: 2022-12-03 | Discharge: 2022-12-03 | Disposition: A | Discharge status: Home / Self Care | Payer: Medicare Other | Source: Ambulatory Visit | Attending: Anesthesiology | Admitting: Anesthesiology

## 2022-12-03 ENCOUNTER — Encounter (HOSPITAL_COMMUNITY): Payer: Self-pay

## 2022-12-03 VITALS — BP 151/74 | HR 46 | Temp 98.3°F | Ht 64.0 in | Wt 159.0 lb

## 2022-12-03 DIAGNOSIS — I4510 Unspecified right bundle-branch block: Secondary | ICD-10-CM | POA: Insufficient documentation

## 2022-12-03 DIAGNOSIS — Z01818 Encounter for other preprocedural examination: Secondary | ICD-10-CM | POA: Diagnosis present

## 2022-12-03 DIAGNOSIS — C50912 Malignant neoplasm of unspecified site of left female breast: Secondary | ICD-10-CM | POA: Diagnosis not present

## 2022-12-03 DIAGNOSIS — Z01810 Encounter for preprocedural cardiovascular examination: Secondary | ICD-10-CM

## 2022-12-03 DIAGNOSIS — Z87891 Personal history of nicotine dependence: Secondary | ICD-10-CM | POA: Insufficient documentation

## 2022-12-03 DIAGNOSIS — I350 Nonrheumatic aortic (valve) stenosis: Secondary | ICD-10-CM | POA: Insufficient documentation

## 2022-12-03 DIAGNOSIS — I10 Essential (primary) hypertension: Secondary | ICD-10-CM

## 2022-12-03 HISTORY — DX: Unspecified hearing loss, unspecified ear: H91.90

## 2022-12-03 HISTORY — DX: Malignant (primary) neoplasm, unspecified: C80.1

## 2022-12-03 HISTORY — DX: Personal history of other medical treatment: Z92.89

## 2022-12-03 HISTORY — DX: Systemic lupus erythematosus, unspecified: M32.9

## 2022-12-03 HISTORY — DX: Cardiac murmur, unspecified: R01.1

## 2022-12-03 HISTORY — DX: Hemothorax: J94.2

## 2022-12-03 HISTORY — DX: Reserved for concepts with insufficient information to code with codable children: IMO0002

## 2022-12-03 HISTORY — DX: Headache, unspecified: R51.9

## 2022-12-03 HISTORY — DX: Nonrheumatic aortic (valve) stenosis: I35.0

## 2022-12-03 HISTORY — DX: Essential (primary) hypertension: I10

## 2022-12-03 HISTORY — DX: Other complications of anesthesia, initial encounter: T88.59XA

## 2022-12-03 HISTORY — DX: Chronic kidney disease, unspecified: N18.9

## 2022-12-03 HISTORY — DX: Other specified postprocedural states: Z98.890

## 2022-12-03 HISTORY — DX: Cerebral infarction, unspecified: I63.9

## 2022-12-03 LAB — ECHOCARDIOGRAM COMPLETE
AR max vel: 1.06 cm2
AV Area VTI: 1.08 cm2
AV Area mean vel: 1.13 cm2
AV Mean grad: 32 mmHg
AV Peak grad: 56.5 mmHg
Ao pk vel: 3.76 m/s
Area-P 1/2: 2.26 cm2
Height: 64 in
S' Lateral: 2.6 cm
Weight: 2544 oz

## 2022-12-03 NOTE — Progress Notes (Addendum)
PCP - Dr. Merri Brunette  Cardiologist - saw Dr. Eldridge Dace in 2014  EP-no  Endocrine-no  Pulm-no  Chest x-ray - na  EKG -12/03/22  Stress Test - no  ECHO - no  Cardiac Cath - no  Nerve Stimulator-no  Dialysis-no  Sleep Study - no CPAP - no  LABS-11/24/22 CBC with diff and CMP was drawn  ASA-81 mg- patient was not given instructions, I instructed patient to call surgeon's office and ask.  ERAS-yes  HA1C-no GLP-1-no Fasting Blood Sugar - no Checks Blood Sugar ___0__ times a day  Anesthesia- Allsion Zelenak, PA-C will be seeing patient  Pt denies having chest pain, sob, or fever at this time. All instructions explained to the pt, with a verbal understanding of the material. Pt agrees to go over the instructions while at home for a better understanding. The opportunity to ask questions was provided.

## 2022-12-03 NOTE — Progress Notes (Signed)
Anesthesia PAT Evaluation:  Case: [id number] Date/Time: 12/09/22 0715   Procedure: LEFT MASTECTOMY (Left) - GEN (TIVA) w/ PEC BLOCK   Anesthesia type: General   Pre-op diagnosis: LEFT BREAST CANCER   Location: [facility] ROOM 04 / [facility] OR   Surgeons: Emelia Loron, MD       DISCUSSION: Patient is an 82 year old female scheduled for the above procedure. She was moved to [facility] from [facility] due to cardiac history.   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 Notes indicate she has a left "frozen shoulder". She is hard of hearing.  She was previously followed by cardiologist Everette Rank, MD for afib. Last office note seen (scanned under Media tab) was from 01/25/12. He notes she had been on warfarin for afib, but had a left hemothorax requiring a chest tube then VATS/decortication 06/27/09. Warfarin was stopped. Then she had a cavernous angioma bleed 08/06/10, so ASA was held temporarily by neurosurgery. She has not had ongoing follow-up, reportedly because she had not had known recurrent afib.  She was recently diagnosed with left breast cancer.  By oncology notes, "Mastectomy (because of her limitation of range of motion she is not a candidate for sentinel lymph node surgery or radiation)."  PCP is Merri Brunette, MD with [facility]. She reported last visit within the past  2 months.   Patient was evaluated at her 12/03/22 PAT visit. Seen with anesthesiologist Corky Sox, MD. Patient's son Jennette Kettle present. Patient in a hospital wheelchair. She ambulates  with a walker. She denied chest pain or SOB at rest. She has had BLE edema for the past 8-9 months. Occasional dizziness without syncope. She does not think she could climb up a flight of stairs due to her legs/deconditioning. She denied cardiac testing since 2011. She does have a grade III/VI murmur. Lungs clear anteriorly. 3+ LE edema.   Given her LE edema and murmur a preoperative echocardiogram was ordered by Dr. Maple Hudson and is scheduled for 12/03/22 1:00 PM.  Labs were not repeated at PAT since CBC and CMP were done at the [facility] on 11/24/22. EKG slower but otherwise stable with SB, PACs, RBBB. Was asymptomatic of bradycardia at PAT visit.   ADDENDUM 12/06/22 3:15 PM: 12/03/22 echo showed LVEF 60-65%, no regional wall abnormalities, normal diastolic parameters, normal RV systolic function, mildly dilated LA, mild MR, calcified AV, trivial AR, moderate AAS with mean gradient 32 mmHg, peak gradient 56.5 mmHg, AVA by VTI 1.08 cm.  Dr. Maple Hudson plans to review results/recommendations for follow-up when she comes in for surgery.   She had preoperative medical evaluation at [facility] by Sanda Klein, PA on 12/06/22.   ADDENDUM 12/08/22 2:08 PM: [facility] records received. Lab results from 12/06/22 included: WBC 7.1, hemoglobin 10.4, hematocrit 32.2, platelet count 180, MCV 102.5, glucose 108, creatinine 1.47, EGFR 35, sodium 144, potassium 5.1, BUN 51, chloride 111, calcium 9.3, albumin 3.9. EKG showed SR versus ectopic atrial rhythm, right BBB (known). Per Bryson Corona, PA-C, "We have completed lab work and EKG as requested to establish cardiac clearance for the patient prior to left mastectomy surgery.  Patient underwent echocardiogram on Friday, July 26,  in addition to the EKG completed on 12/06/2022.  At this time, we anticipate the patient is cleared for surgery, and medically optimized.  Please follow-up with any questions."   VS: BP (!) 151/74   Pulse (!) 46   Temp 36.8 C   Ht 5\' 4"  (1.626 m)    Wt 72.1 kg   SpO2 98%   BMI 27.29 kg/m    PROVIDERS: Merri Brunette, MD is PCP  [name], MD is HEM-ONC Lonie Peak, MD is RAD-ONC   LABS: Most recent results in [facility] include: Lab Results  Component Value Date   WBC 9.6 11/24/2022   HGB 10.4 (L) 11/24/2022   HCT 32.9 (L) 11/24/2022   PLT 221 11/24/2022   GLUCOSE 90 11/24/2022   ALT 13 11/24/2022   AST 15 11/24/2022   NA 143 11/24/2022   K 4.7 11/24/2022   CL 113 (H) 11/24/2022   CREATININE 1.44 (H) 11/24/2022   BUN 60 (H) 11/24/2022   CO2 25 11/24/2022  Lab results appear stable when compared to labs from July 2023.    IMAGES: CXR 11/30/21: FINDINGS: Cardiac and mediastinal contours are unchanged. Mild bibasilar atelectasis. No focal consolidation. No pleural effusion or pneumothorax. IMPRESSION: No active cardiopulmonary disease.    EKG: EKG 12/06/22 [facility]): Ventricular rate 49 bpm.  Sinus versus ectopic atrial rhythm.  Right bundle branch block.  EKG 12/03/22:  Sinus bradycardia with Premature atrial complexes Right bundle branch block - Ventricular HR 46 bpm, previously 62 bpm on 11/30/21 tracing   CV: Echo 12/03/22:  IMPRESSIONS   1. Left ventricular ejection fraction, by estimation, is 60 to 65%. The  left ventricle has normal function. The left ventricle has no regional  wall motion abnormalities. Left ventricular diastolic parameters were  normal.   2. Right ventricular systolic function is normal. The right ventricular  size is normal.   3. Left atrial size was mildly dilated.   4. The mitral valve is normal in structure. Mild mitral valve  regurgitation. No evidence of mitral stenosis.   5. The aortic valve is calcified. Aortic valve regurgitation is trivial.  Moderate aortic valve stenosis. Aortic valve mean gradient  measures 32.0 mmHg. Aortic valve peak gradient measures 56.5 mmHg. Aortic valve area, by VTI measures 1.08  cm.   6. The inferior vena cava is normal in size with  greater than 50%  respiratory variability, suggesting right atrial pressure of 3 mmHg.  - Comparison echo 05/14/09: LVEF 60-65%, no regional wall motion abnormalities, grade 1 diastolic dysfunction, AV sclerosis with mildly elevated mean gradient 9 mmHg(S) but opens will without evidence of AS, AV peak gradient 17 mmHg, mildly dilated LA/RA, normal RV size and systolic function, PA peak pressure 33 mmHg.   Past Medical History:  Diagnosis Date   Atrial fibrillation (HCC) 05/13/2009   Cancer (HCC)    left breast cancer 11/18/22   CKD (chronic kidney disease)    Complication of anesthesia    Family history of breast cancer    Family history of prostate cancer    Family history of stomach cancer    Headache    migraine - years ago   Heart murmur    Hemothorax, left    in setting supratherapeutic INR, s/p left VATS, drainage of effusion & decortication 06/27/09   History of blood transfusion    HOH (hard of hearing)    Hypercholesteremia    Hypertension    Hypothyroidism    Lupus (HCC)    scalp  Obesity    PONV (postoperative nausea and vomiting)    Stroke [facility])    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    Past Surgical History:  Procedure Laterality Date   BREAST BIOPSY Left 11/18/2022   [place] LT BREAST BX W LOC DEV 1ST LESION IMG BX SPEC US GUIDE 11/18/2022 [facility]   BREAST BIOPSY Left 11/18/2022   [place] LT BREAST BX W LOC DEV EA ADD LESION IMG BX SPEC US GUIDE 11/18/2022 [facility]   CHOLECYSTECTOMY     eye irritation/pterygium removed     HERNIA REPAIR     TOE SURGERY      MEDICATIONS:  acetaminophen (TYLENOL) 500 MG tablet   aspirin 81 MG tablet   atorvastatin (LIPITOR) 20 MG tablet   diltiazem (DILACOR XR) 120 MG 24 hr capsule   furosemide (LASIX) 20 MG tablet   ibuprofen (ADVIL) 200 MG tablet   levothyroxine (SYNTHROID) 150 MCG tablet   losartan (COZAAR) 100 MG tablet   metoprolol tartrate (LOPRESSOR) 25 MG tablet   traMADol (ULTRAM) 50  MG tablet   No current facility-administered medications for this encounter.    tranexamic acid (CYKLOKAPRON) 3,000 mg in sodium chloride 0.9 % 100 mL Topical Application    Shonna Chock, PA-C Surgical Short Stay/Anesthesiology [facility] Phone [phone] [facility] Phone [phone] 12/03/2022 1:24 PM      :

## 2022-12-06 ENCOUNTER — Telehealth: Payer: Self-pay | Admitting: Hematology and Oncology

## 2022-12-06 ENCOUNTER — Encounter (HOSPITAL_COMMUNITY): Payer: Self-pay

## 2022-12-06 DIAGNOSIS — Z01810 Encounter for preprocedural cardiovascular examination: Secondary | ICD-10-CM | POA: Diagnosis not present

## 2022-12-06 DIAGNOSIS — E039 Hypothyroidism, unspecified: Secondary | ICD-10-CM | POA: Diagnosis not present

## 2022-12-06 NOTE — Anesthesia Preprocedure Evaluation (Signed)
Anesthesia Evaluation  Patient identified by MRN, date of birth, ID band Patient awake    Reviewed: Allergy & Precautions, NPO status , Patient's Chart, lab work & pertinent test results  History of Anesthesia Complications (+) PONV and history of anesthetic complications  Airway Mallampati: II       Dental  (+) Edentulous Upper, Edentulous Lower   Pulmonary former smoker   breath sounds clear to auscultation       Cardiovascular hypertension, + dysrhythmias Atrial Fibrillation + Valvular Problems/Murmurs AS  Rhythm:Regular Rate:Normal  Echo 12/03/22:  IMPRESSIONS   1. Left ventricular ejection fraction, by estimation, is 60 to 65%. The  left ventricle has normal function. The left ventricle has no regional  wall motion abnormalities. Left ventricular diastolic parameters were  normal.   2. Right ventricular systolic function is normal. The right ventricular  size is normal.   3. Left atrial size was mildly dilated.   4. The mitral valve is normal in structure. Mild mitral valve  regurgitation. No evidence of mitral stenosis.   5. The aortic valve is calcified. Aortic valve regurgitation is trivial.  Moderate aortic valve stenosis. Aortic valve mean gradient  measures 32.0 mmHg. Aortic valve peak gradient measures 56.5 mmHg. Aortic valve area, by VTI measures 1.08  cm.   6. The inferior vena cava is normal in size with greater than 50%  respiratory variability, suggesting right atrial pressure of 3 mmHg.     Neuro/Psych  Headaches CVA  negative psych ROS   GI/Hepatic negative GI ROS, Neg liver ROS,,,  Endo/Other  Hypothyroidism    Renal/GU Renal disease     Musculoskeletal negative musculoskeletal ROS (+)    Abdominal   Peds  Hematology negative hematology ROS (+)   Anesthesia Other Findings   Reproductive/Obstetrics                             Anesthesia Physical Anesthesia  Plan  ASA: 3  Anesthesia Plan: General   Post-op Pain Management: Tylenol PO (pre-op)*   Induction: Intravenous  PONV Risk Score and Plan: 4 or greater and Ondansetron and Treatment may vary due to age or medical condition  Airway Management Planned: LMA  Additional Equipment: None  Intra-op Plan:   Post-operative Plan: Extubation in OR  Informed Consent:   Plan Discussed with: CRNA  Anesthesia Plan Comments: (PAT note written by Shonna Chock, PA-C.  )       Anesthesia Quick Evaluation

## 2022-12-06 NOTE — Telephone Encounter (Signed)
Patient is aware of upcoming appointment times/dates.  

## 2022-12-08 NOTE — Progress Notes (Signed)
Patient was called to inform that the surgery time for tomorrow was changed to 09:30 o'clock. Patient wasn't available and this writer left a message. Patient was instructed to be at the hospital at 07:30 o'clock and drink clear liquids until 06:30 o'clock. This Clinical research associate called the patient's son and also left a message.

## 2022-12-09 ENCOUNTER — Ambulatory Visit (HOSPITAL_BASED_OUTPATIENT_CLINIC_OR_DEPARTMENT_OTHER): Payer: Medicare Other | Admitting: Anesthesiology

## 2022-12-09 ENCOUNTER — Ambulatory Visit (HOSPITAL_COMMUNITY): Payer: Medicare Other | Admitting: Anesthesiology

## 2022-12-09 ENCOUNTER — Encounter (HOSPITAL_COMMUNITY)
Admission: RE | Disposition: A | Discharge status: Home / Self Care | Payer: Self-pay | Source: Home / Self Care | Attending: General Surgery

## 2022-12-09 ENCOUNTER — Other Ambulatory Visit: Payer: Self-pay

## 2022-12-09 ENCOUNTER — Encounter (HOSPITAL_COMMUNITY): Payer: Self-pay | Admitting: General Surgery

## 2022-12-09 ENCOUNTER — Observation Stay (HOSPITAL_COMMUNITY)
Admission: RE | Admit: 2022-12-09 | Discharge: 2022-12-10 | Disposition: A | Discharge status: Home / Self Care | Payer: Medicare Other | Source: Home / Self Care | Attending: General Surgery | Admitting: General Surgery

## 2022-12-09 DIAGNOSIS — R2681 Unsteadiness on feet: Secondary | ICD-10-CM | POA: Diagnosis not present

## 2022-12-09 DIAGNOSIS — Z79899 Other long term (current) drug therapy: Secondary | ICD-10-CM | POA: Insufficient documentation

## 2022-12-09 DIAGNOSIS — C50912 Malignant neoplasm of unspecified site of left female breast: Secondary | ICD-10-CM | POA: Diagnosis not present

## 2022-12-09 DIAGNOSIS — C50919 Malignant neoplasm of unspecified site of unspecified female breast: Principal | ICD-10-CM | POA: Diagnosis present

## 2022-12-09 DIAGNOSIS — N641 Fat necrosis of breast: Secondary | ICD-10-CM | POA: Diagnosis not present

## 2022-12-09 DIAGNOSIS — Z170 Estrogen receptor positive status [ER+]: Secondary | ICD-10-CM | POA: Insufficient documentation

## 2022-12-09 DIAGNOSIS — I4891 Unspecified atrial fibrillation: Secondary | ICD-10-CM | POA: Insufficient documentation

## 2022-12-09 DIAGNOSIS — Z9012 Acquired absence of left breast and nipple: Secondary | ICD-10-CM

## 2022-12-09 DIAGNOSIS — Z87891 Personal history of nicotine dependence: Secondary | ICD-10-CM | POA: Insufficient documentation

## 2022-12-09 DIAGNOSIS — E039 Hypothyroidism, unspecified: Secondary | ICD-10-CM | POA: Diagnosis not present

## 2022-12-09 DIAGNOSIS — C50112 Malignant neoplasm of central portion of left female breast: Principal | ICD-10-CM | POA: Insufficient documentation

## 2022-12-09 DIAGNOSIS — I10 Essential (primary) hypertension: Secondary | ICD-10-CM | POA: Insufficient documentation

## 2022-12-09 DIAGNOSIS — Z7982 Long term (current) use of aspirin: Secondary | ICD-10-CM | POA: Insufficient documentation

## 2022-12-09 DIAGNOSIS — G8918 Other acute postprocedural pain: Secondary | ICD-10-CM | POA: Diagnosis not present

## 2022-12-09 DIAGNOSIS — Z8673 Personal history of transient ischemic attack (TIA), and cerebral infarction without residual deficits: Secondary | ICD-10-CM | POA: Insufficient documentation

## 2022-12-09 HISTORY — PX: SIMPLE MASTECTOMY WITH AXILLARY SENTINEL NODE BIOPSY: SHX6098

## 2022-12-09 LAB — GLUCOSE, CAPILLARY: Glucose-Capillary: 202 mg/dL — ABNORMAL HIGH (ref 70–99)

## 2022-12-09 SURGERY — SIMPLE MASTECTOMY
Anesthesia: General | Site: Breast | Laterality: Left

## 2022-12-09 MED ORDER — 0.9 % SODIUM CHLORIDE (POUR BTL) OPTIME
TOPICAL | Status: DC | PRN
  Administered 2022-12-09: 1000 mL

## 2022-12-09 MED ORDER — TRANEXAMIC ACID 1000 MG/10ML IV SOLN
2000.0000 mg | Freq: Once | INTRAVENOUS | Status: AC
  Administered 2022-12-09: 2000 mg via TOPICAL
  Filled 2022-12-09: qty 20

## 2022-12-09 MED ORDER — OXYCODONE HCL 5 MG PO TABS: 5.0000 mg | ORAL_TABLET | Freq: Once | ORAL | Status: DC | PRN

## 2022-12-09 MED ORDER — SODIUM CHLORIDE 0.9 % IV SOLN: INTRAVENOUS | Status: DC

## 2022-12-09 MED ORDER — CHLORHEXIDINE GLUCONATE CLOTH 2 % EX PADS: 6.0000 | MEDICATED_PAD | Freq: Once | CUTANEOUS | Status: DC

## 2022-12-09 MED ORDER — PROPOFOL 10 MG/ML IV BOLUS
INTRAVENOUS | Status: AC
  Filled 2022-12-09: qty 20

## 2022-12-09 MED ORDER — FENTANYL CITRATE (PF) 250 MCG/5ML IJ SOLN
INTRAMUSCULAR | Status: DC | PRN
  Administered 2022-12-09 (×5): 25 ug via INTRAVENOUS

## 2022-12-09 MED ORDER — ACETAMINOPHEN 500 MG PO TABS
ORAL_TABLET | ORAL | Status: AC
  Administered 2022-12-09: 1000 mg via ORAL
  Filled 2022-12-09: qty 2

## 2022-12-09 MED ORDER — BUPIVACAINE-EPINEPHRINE (PF) 0.5% -1:200000 IJ SOLN
INTRAMUSCULAR | Status: DC | PRN
  Administered 2022-12-09: 20 mL

## 2022-12-09 MED ORDER — OXYCODONE HCL 5 MG/5ML PO SOLN: 5.0000 mg | Freq: Once | ORAL | Status: DC | PRN

## 2022-12-09 MED ORDER — ENSURE PRE-SURGERY PO LIQD: 296.0000 mL | Freq: Once | ORAL | Status: DC

## 2022-12-09 MED ORDER — PROPOFOL 10 MG/ML IV BOLUS
INTRAVENOUS | Status: DC | PRN
  Administered 2022-12-09: 60 mg via INTRAVENOUS

## 2022-12-09 MED ORDER — LOSARTAN POTASSIUM 25 MG PO TABS
25.0000 mg | ORAL_TABLET | Freq: Every day | ORAL | Status: DC
  Administered 2022-12-10: 25 mg via ORAL
  Filled 2022-12-09: qty 1

## 2022-12-09 MED ORDER — ORAL CARE MOUTH RINSE: 15.0000 mL | Freq: Once | OROMUCOSAL | Status: AC

## 2022-12-09 MED ORDER — TRAMADOL HCL 50 MG PO TABS: 50.0000 mg | ORAL_TABLET | Freq: Two times a day (BID) | ORAL | Status: DC | PRN

## 2022-12-09 MED ORDER — ACETAMINOPHEN 500 MG PO TABS: 1000.0000 mg | ORAL_TABLET | Freq: Three times a day (TID) | ORAL | Status: DC | PRN

## 2022-12-09 MED ORDER — ACETAMINOPHEN 10 MG/ML IV SOLN: 1000.0000 mg | Freq: Once | INTRAVENOUS | Status: DC | PRN

## 2022-12-09 MED ORDER — PHENYLEPHRINE 80 MCG/ML (10ML) SYRINGE FOR IV PUSH (FOR BLOOD PRESSURE SUPPORT)
PREFILLED_SYRINGE | INTRAVENOUS | Status: DC | PRN
  Administered 2022-12-09: 160 ug via INTRAVENOUS

## 2022-12-09 MED ORDER — ONDANSETRON 4 MG PO TBDP: 4.0000 mg | ORAL_TABLET | Freq: Four times a day (QID) | ORAL | Status: DC | PRN

## 2022-12-09 MED ORDER — LEVOTHYROXINE SODIUM 75 MCG PO TABS
150.0000 ug | ORAL_TABLET | Freq: Every day | ORAL | Status: DC
  Administered 2022-12-10: 150 ug via ORAL
  Filled 2022-12-09: qty 2

## 2022-12-09 MED ORDER — HEMOSTATIC AGENTS (NO CHARGE) OPTIME
TOPICAL | Status: DC | PRN
  Administered 2022-12-09 (×2): 1 via TOPICAL

## 2022-12-09 MED ORDER — CEFAZOLIN SODIUM-DEXTROSE 2-4 GM/100ML-% IV SOLN
INTRAVENOUS | Status: AC
  Filled 2022-12-09: qty 100

## 2022-12-09 MED ORDER — LACTATED RINGERS IV SOLN: INTRAVENOUS | Status: DC

## 2022-12-09 MED ORDER — ACETAMINOPHEN 500 MG PO TABS: 1000.0000 mg | ORAL_TABLET | ORAL | Status: AC

## 2022-12-09 MED ORDER — ACETAMINOPHEN 325 MG PO TABS: 325.0000 mg | ORAL_TABLET | ORAL | Status: DC | PRN

## 2022-12-09 MED ORDER — DEXAMETHASONE SODIUM PHOSPHATE 10 MG/ML IJ SOLN
INTRAMUSCULAR | Status: DC | PRN
  Administered 2022-12-09: 5 mg via INTRAVENOUS

## 2022-12-09 MED ORDER — PHENYLEPHRINE HCL-NACL 20-0.9 MG/250ML-% IV SOLN
INTRAVENOUS | Status: DC | PRN
Start: 2022-12-09 — End: 2022-12-09
  Administered 2022-12-09: 25 ug/min via INTRAVENOUS

## 2022-12-09 MED ORDER — CHLORHEXIDINE GLUCONATE 0.12 % MT SOLN
15.0000 mL | Freq: Once | OROMUCOSAL | Status: AC
  Administered 2022-12-09: 15 mL via OROMUCOSAL
  Filled 2022-12-09: qty 15

## 2022-12-09 MED ORDER — FUROSEMIDE 20 MG PO TABS
20.0000 mg | ORAL_TABLET | Freq: Every day | ORAL | Status: DC
  Administered 2022-12-10: 20 mg via ORAL
  Filled 2022-12-09: qty 1

## 2022-12-09 MED ORDER — ATORVASTATIN CALCIUM 10 MG PO TABS
20.0000 mg | ORAL_TABLET | Freq: Every day | ORAL | Status: DC
  Administered 2022-12-10: 20 mg via ORAL
  Filled 2022-12-09: qty 2

## 2022-12-09 MED ORDER — CEFAZOLIN SODIUM-DEXTROSE 2-4 GM/100ML-% IV SOLN
2.0000 g | INTRAVENOUS | Status: AC
  Administered 2022-12-09: 2 g via INTRAVENOUS

## 2022-12-09 MED ORDER — BUPIVACAINE-EPINEPHRINE (PF) 0.25% -1:200000 IJ SOLN
INTRAMUSCULAR | Status: AC
  Filled 2022-12-09: qty 30

## 2022-12-09 MED ORDER — ACETAMINOPHEN 160 MG/5ML PO SOLN: 325.0000 mg | ORAL | Status: DC | PRN

## 2022-12-09 MED ORDER — ONDANSETRON HCL 4 MG/2ML IJ SOLN
INTRAMUSCULAR | Status: DC | PRN
  Administered 2022-12-09: 4 mg via INTRAVENOUS

## 2022-12-09 MED ORDER — FENTANYL CITRATE (PF) 250 MCG/5ML IJ SOLN
INTRAMUSCULAR | Status: AC
  Filled 2022-12-09: qty 5

## 2022-12-09 MED ORDER — METOPROLOL TARTRATE 25 MG PO TABS
25.0000 mg | ORAL_TABLET | Freq: Two times a day (BID) | ORAL | Status: DC
  Administered 2022-12-09 – 2022-12-10 (×2): 25 mg via ORAL
  Filled 2022-12-09 (×2): qty 1

## 2022-12-09 MED ORDER — DILTIAZEM HCL ER COATED BEADS 120 MG PO CP24
120.0000 mg | ORAL_CAPSULE | Freq: Every day | ORAL | Status: DC
  Administered 2022-12-10: 120 mg via ORAL
  Filled 2022-12-09: qty 1

## 2022-12-09 MED ORDER — PROPOFOL 500 MG/50ML IV EMUL
INTRAVENOUS | Status: DC | PRN
Start: 2022-12-09 — End: 2022-12-09
  Administered 2022-12-09: 75 ug/kg/min via INTRAVENOUS

## 2022-12-09 MED ORDER — ONDANSETRON HCL 4 MG/2ML IJ SOLN: 4.0000 mg | Freq: Four times a day (QID) | INTRAMUSCULAR | Status: DC | PRN

## 2022-12-09 MED ORDER — SIMETHICONE 80 MG PO CHEW: 40.0000 mg | CHEWABLE_TABLET | Freq: Four times a day (QID) | ORAL | Status: DC | PRN

## 2022-12-09 MED ORDER — FENTANYL CITRATE (PF) 100 MCG/2ML IJ SOLN: 25.0000 ug | INTRAMUSCULAR | Status: DC | PRN

## 2022-12-09 SURGICAL SUPPLY — 43 items
ADH SKN CLS APL DERMABOND .7 (GAUZE/BANDAGES/DRESSINGS) ×2
APL PRP STRL LF DISP 70% ISPRP (MISCELLANEOUS) ×1
APPLIER CLIP 9.375 MED OPEN (MISCELLANEOUS) ×1
APR CLP MED 9.3 20 MLT OPN (MISCELLANEOUS) ×1
BINDER BREAST LRG (GAUZE/BANDAGES/DRESSINGS) IMPLANT
BINDER BREAST XLRG (GAUZE/BANDAGES/DRESSINGS) IMPLANT
BIOPATCH RED 1 DISK 7.0 (GAUZE/BANDAGES/DRESSINGS) ×1 IMPLANT
CHLORAPREP W/TINT 26 (MISCELLANEOUS) ×1 IMPLANT
CLIP APPLIE 9.375 MED OPEN (MISCELLANEOUS) ×1 IMPLANT
COVER SURGICAL LIGHT HANDLE (MISCELLANEOUS) ×1 IMPLANT
DERMABOND ADVANCED .7 DNX12 (GAUZE/BANDAGES/DRESSINGS) ×1 IMPLANT
DRAIN CHANNEL 19F RND (DRAIN) ×1 IMPLANT
DRAPE TOP ARMCOVERS (MISCELLANEOUS) ×1 IMPLANT
DRAPE U-SHAPE 76X120 STRL (DRAPES) ×1 IMPLANT
DRSG TEGADERM 4X4.5 CHG (GAUZE/BANDAGES/DRESSINGS) IMPLANT
DRSG TEGADERM 4X4.75 (GAUZE/BANDAGES/DRESSINGS) ×1 IMPLANT
ELECT BLADE 4.0 EZ CLEAN MEGAD (MISCELLANEOUS) ×1
ELECT CAUTERY BLADE 6.4 (BLADE) ×1 IMPLANT
ELECT REM PT RETURN 9FT ADLT (ELECTROSURGICAL) ×1
ELECTRODE BLDE 4.0 EZ CLN MEGD (MISCELLANEOUS) ×1 IMPLANT
ELECTRODE REM PT RTRN 9FT ADLT (ELECTROSURGICAL) ×1 IMPLANT
EVACUATOR SILICONE 100CC (DRAIN) ×1 IMPLANT
GAUZE PAD ABD 8X10 STRL (GAUZE/BANDAGES/DRESSINGS) ×2 IMPLANT
GLOVE BIO SURGEON STRL SZ7 (GLOVE) ×1 IMPLANT
GLOVE BIOGEL PI IND STRL 7.5 (GLOVE) ×1 IMPLANT
GOWN STRL REUS W/ TWL LRG LVL3 (GOWN DISPOSABLE) ×3 IMPLANT
GOWN STRL REUS W/TWL LRG LVL3 (GOWN DISPOSABLE) ×3
KIT BASIN OR (CUSTOM PROCEDURE TRAY) ×1 IMPLANT
KIT TURNOVER KIT B (KITS) ×1 IMPLANT
NS IRRIG 1000ML POUR BTL (IV SOLUTION) ×1 IMPLANT
PACK GENERAL/GYN (CUSTOM PROCEDURE TRAY) ×1 IMPLANT
PAD ARMBOARD 7.5X6 YLW CONV (MISCELLANEOUS) ×1 IMPLANT
PENCIL SMOKE EVACUATOR (MISCELLANEOUS) ×1 IMPLANT
STAPLER VISISTAT 35W (STAPLE) IMPLANT
STRIP CLOSURE SKIN 1/2X4 (GAUZE/BANDAGES/DRESSINGS) ×1 IMPLANT
SUT ETHILON 2 0 FS 18 (SUTURE) ×1 IMPLANT
SUT MNCRL AB 4-0 PS2 18 (SUTURE) ×1 IMPLANT
SUT SILK 2 0 SH (SUTURE) ×1 IMPLANT
SUT VIC AB 2-0 BRD 54 (SUTURE) IMPLANT
SUT VIC AB 2-0 SH 18 (SUTURE) ×1 IMPLANT
SUT VIC AB 3-0 SH 18 (SUTURE) ×1 IMPLANT
TOWEL GREEN STERILE (TOWEL DISPOSABLE) ×1 IMPLANT
TOWEL GREEN STERILE FF (TOWEL DISPOSABLE) ×1 IMPLANT

## 2022-12-09 NOTE — Anesthesia Procedure Notes (Signed)
Procedure Name: LMA Insertion Date/Time: 12/09/2022 8:36 AM  Performed by: Elliot Dally, CRNAPre-anesthesia Checklist: Patient identified, Emergency Drugs available, Suction available and Patient being monitored Patient Re-evaluated:Patient Re-evaluated prior to induction Oxygen Delivery Method: Circle System Utilized Preoxygenation: Pre-oxygenation with 100% oxygen Induction Type: IV induction Ventilation: Mask ventilation without difficulty LMA: LMA inserted LMA Size: 4.0 Number of attempts: 1 Airway Equipment and Method: Bite block Placement Confirmation: positive ETCO2 Tube secured with: Tape Dental Injury: Teeth and Oropharynx as per pre-operative assessment

## 2022-12-09 NOTE — Discharge Instructions (Signed)
[facility], [place] [phone]  MASTECTOMY: POST OP INSTRUCTIONS Take 400 mg of ibuprofen every 8 hours or 650 mg tylenol every 6 hours for next 72 hours then as needed. Use ice several times daily also. Take your usually prescribed medications unless otherwise directed. You should follow a light diet the first 24 hours after surgery.  Resume your normal diet the day after surgery. Most patients will experience some swelling and bruising on the chest and underarm.  Ice packs will help.  Swelling and bruising can take several days to resolve. Wear the binder for 72 hours day and night. After night please wear during the day.  It is common to experience some constipation if taking pain medication after surgery.  Increasing fluid intake and taking a stool softener (such as Colace) will usually help or prevent this problem from occurring.  A mild laxative (Milk of Magnesia or Miralax) should be taken according to package instructions if there are no bowel movements after 48 hours. There is glue and steristrips on your incision. They will come off in the next few weeks.  You may take a shower 48 hours after surgery.  Any sutures will be removed at an office visit DRAINS:  If you have drains in place, it is important to keep a list of the amount of drainage produced each day in your drains.  Before leaving the hospital, you should be instructed on drain care.  Call our office if you have any questions about your drains. I will remove your drains when they put out less than 30 cc or ml for 2 consecutive days. ACTIVITIES:  You may resume regular (light) daily activities beginning the next day--such as daily self-care, walking, climbing stairs--gradually increasing activities as tolerated.   Refrain from any heavy lifting or straining until approved by your doctor. You should see your doctor in the office for a follow-up appointment approximately 2 weeks after your surgery.   Expect your pathology  report 3-4 business days after surgery. OTHER INSTRUCTIONS: ______________________________________________________________________________________________ ____________________________________________________________________________________________ WHEN TO CALL YOUR DR Lindel Marcell: Fever over 101.0 Nausea and/or vomiting Extreme swelling or bruising Continued bleeding from incision. Increased pain, redness, or drainage from the incision. The clinic staff is available to answer your questions during regular business hours.  Please don't hesitate to call and ask to speak to one of the nurses for clinical concerns.  If you have a medical emergency, go to the nearest emergency room or call 911.  A surgeon from [facility] is always on call at the hospital. [address], [address], [place], [place]  [address] ? [address] [address], [place], [place]   [address] [phone] ? [phone] ? FAX [phone] Web site: [email or web address]

## 2022-12-09 NOTE — Anesthesia Procedure Notes (Signed)
Anesthesia Regional Block: Pectoralis block   Pre-Anesthetic Checklist: , timeout performed,  Correct Patient, Correct Site, Correct Laterality,  Correct Procedure, Correct Position, site marked,  Risks and benefits discussed,  Surgical consent,  Pre-op evaluation,  At surgeon's request and post-op pain management  Laterality: Left  Prep: chloraprep       Needles:  Injection technique: Single-shot  Needle Type: Echogenic Stimulator Needle     Needle Length: 9cm  Needle Gauge: 21     Additional Needles:   Procedures:,,,, ultrasound used (permanent image in chart),,    Narrative:  Start time: 12/09/2022 8:05 AM End time: 12/09/2022 8:10 AM Injection made incrementally with aspirations every 5 mL.  Performed by: Personally  Anesthesiologist: Shelton Silvas, MD  Additional Notes: Discussed risks and benefits of the nerve block in detail, including but not limited vascular injury, permanent nerve damage and infection.   Patient tolerated the procedure well. Local anesthetic introduced in an incremental fashion under minimal resistance after negative aspirations. No paresthesias were elicited. After completion of the procedure, no acute issues were identified and patient continued to be monitored by RN.

## 2022-12-09 NOTE — Interval H&P Note (Signed)
History and Physical Interval Note:  12/09/2022 7:46 AM  Sophia Woods  has presented today for surgery, with the diagnosis of LEFT BREAST CANCER.  The various methods of treatment have been discussed with the patient and family. After consideration of risks, benefits and other options for treatment, the patient has consented to  Procedure(s) with comments: LEFT MASTECTOMY (Left) - GEN (TIVA) w/ PEC BLOCK as a surgical intervention.  The patient's history has been reviewed, patient examined, no change in status, stable for surgery.  I have reviewed the patient's chart and labs.  Questions were answered to the patient's satisfaction.     Emelia Loron

## 2022-12-09 NOTE — Anesthesia Postprocedure Evaluation (Signed)
Anesthesia Post Note  Patient: Sophia Woods  Procedure(s) Performed: LEFT MASTECTOMY (Left: Breast)     Patient location during evaluation: PACU Anesthesia Type: General Level of consciousness: awake and alert Pain management: pain level controlled Vital Signs Assessment: post-procedure vital signs reviewed and stable Respiratory status: spontaneous breathing, nonlabored ventilation, respiratory function stable and patient connected to nasal cannula oxygen Cardiovascular status: blood pressure returned to baseline and stable Postop Assessment: no apparent nausea or vomiting Anesthetic complications: no  No notable events documented.  Last Vitals:  Vitals:   12/09/22 1230 12/09/22 1245  BP: (!) 136/50 (!) 139/53  Pulse: (!) 53 (!) 53  Resp: 15 16  Temp:    SpO2: 98% 96%    Last Pain:  Vitals:   12/09/22 1115  TempSrc:   PainSc: 0-No pain                 Shelton Silvas

## 2022-12-09 NOTE — Transfer of Care (Signed)
Immediate Anesthesia Transfer of Care Note  Patient: ANAJA LEA  Procedure(s) Performed: LEFT MASTECTOMY (Left: Breast)  Patient Location: PACU  Anesthesia Type:General  Level of Consciousness: drowsy  Airway & Oxygen Therapy: Patient Spontanous Breathing  Post-op Assessment: Report given to RN and Post -op Vital signs reviewed and stable  Post vital signs: Reviewed and stable  Last Vitals:  Vitals Value Taken Time  BP 123/50 12/09/22 0950  Temp    Pulse 48 12/09/22 0952  Resp 20 12/09/22 0955  SpO2 89 % 12/09/22 0952  Vitals shown include unfiled device data.  Last Pain:  Vitals:   12/09/22 0750  TempSrc:   PainSc: 0-No pain         Complications: No notable events documented.

## 2022-12-09 NOTE — Op Note (Signed)
Preoperative diagnosis: Left breast cancer Postoperative diagnosis: Same as above Procedure: Left total mastectomy Surgeon: Dr. Harden Mo Anesthesia: General with a pectoral block Specimens: Left breast tissue marked short stitch superior, long stitch lateral Estimated blood loss: 25 cc Complications: None Drains: 19 French Blake drain to mastectomy space Sponge and count was correct completion Disposition recovery stable condition  Indications: This is an 82 year old female with multiple comorbidities who has a over 5 cm left breast mass.  Her axillary ultrasound is negative.  This is ER positive, PR negative, HER2 negative.  We discussed with her and her family what the options are.  I think she is likely going to live at least another year or 2 so the data would support doing surgery if she can tolerate that.  We plan to do a left mastectomy.  Procedure: After informed consent was obtained the patient was taken to the operating.  She first underwent a pectoral block.  Antibiotics were given.  SCDs were in place.  She was placed under general anesthesia and this was performed as a TIVA.  She was then prepped and draped in standard sterile surgical fashion.  Surgical timeout was then performed.  I made a large elliptical incision to encompass the nipple and areola.  This encompassed a very large tumor.  The tumor was not fixed at least to the pectoralis muscle.  I then created flaps to the clavicle, parasternal area, latissimus laterally.  The inferior limb of my mastectomy scar was at the inframammary fold.  I then remove the breast and the fascia from the pectoralis muscle.  This was passed off the table as a specimen.  I then placed a TXA soaked sponge in the cavity for 5 minutes.  I remove this and obtained hemostasis.  I placed a 37 [place] Blake drain and secured this with a 2-0 nylon.  I closed this with 3-0 Vicryl and 4 Monocryl.  Glue and Steri-Strips were applied.  She tolerated this  well was extubated transferred recovery stable.

## 2022-12-09 NOTE — H&P (Signed)
27 yof who has palpable left breast mass. She has c density tissue. She has 2 components to this. She has a 5.1x3.5x3.5 cm mass retroareolar and then an 8 mm mass superior to that. Clips are 3.5 cm apart. Axillary [place] negative. Biopsy of the smaller lesion is a grade I IDC that is er pos at 100, pr neg, her 2 negative with Ki of 10%. Biopsy of larger lesion is a grade III IDC that is 100% er pos, pr neg, her 2 negative and Ki of 40%.  She has history of stroke, dvt/pe, afib. She has been ill for some time but is now doing really well. She lives at home with help. Uses walker. Does have falls. But she takes care of herself, eats well, washes her clothes etc.  She noted this mass about six months ago.  Son and grandaughter are both present  Review of Systems: A complete review of systems was obtained from the patient. I have reviewed this information and discussed as appropriate with the patient. See HPI as well for other ROS.  Review of Systems  Constitutional: Positive for malaise/fatigue.  Psychiatric/Behavioral: Positive for memory loss.  All other systems reviewed and are negative.   Medical History: Past Medical History:  Diagnosis Date  Aneurysm (CMS-HCC)  Arthritis  History of stroke  Hyperlipidemia  Hypertension  Pulmonary hypertension (CMS/HHS-HCC)   Patient Active Problem List  Diagnosis  Incontinence of feces  Hemorrhoid prolapse   Past Surgical History:  Procedure Laterality Date  CHOLECYSTECTOMY  HERNIA REPAIR  stroke    Allergies  Allergen Reactions  Penicillins Other (See Comments)  Hives  Sulfamethoxazole-Trimethoprim Swelling   Current Outpatient Medications on File Prior to Visit  Medication Sig Dispense Refill  aspirin 81 MG chewable tablet Take by mouth  atorvastatin (LIPITOR) 40 MG tablet Take 40 mg by mouth once daily  calcium carbonate-vitamin D3 500 mg-3.125 mcg (125 unit) per tablet Take by mouth  DILT-XR 120 mg XR capsule Take by mouth   doxycycline (VIBRAMYCIN) 100 MG capsule Take by mouth  FUROsemide (LASIX) 40 MG tablet Take 40 mg by mouth once daily  levothyroxine (SYNTHROID) 50 MCG tablet Take by mouth  losartan (COZAAR) 50 MG tablet Take 100 mg by mouth once daily  metoprolol tartrate (LOPRESSOR) 25 MG tablet Take 25 mg by mouth 2 (two) times daily  clobetasoL (OLUX) 0.05 % topical foam Apply topically 2 (two) times daily  mirtazapine (REMERON) 7.5 MG tablet TAKE 1 TABLET BY MOUTH EVERY DAY AT BEDTIME ONCE A DAY 90 DAYS  traMADoL (ULTRAM) 50 mg tablet Take 50 mg by mouth 2 (two) times daily    Family History  Problem Relation Age of Onset  Breast cancer Sister    Social History   Tobacco Use  Smoking Status Former  Types: Cigarettes  Smokeless Tobacco Never  Marital status: Divorced  Tobacco Use  Smoking status: Former  Types: Cigarettes  Smokeless tobacco: Never     Objective:   Physical Exam Vitals reviewed.  Constitutional:  Appearance: Normal appearance.  Chest:  Breasts: Right: No inverted nipple, mass or nipple discharge.  Left: Mass present. No inverted nipple or nipple discharge.  Comments: Left breast mass about 5-6 cm mobile Lymphadenopathy:  Upper Body:  Right upper body: No supraclavicular or axillary adenopathy.  Left upper body: No supraclavicular or axillary adenopathy.  Neurological:  Mental Status: She is alert.     Assessment and Plan:   Malignant neoplasm of central portion of left breast in  female, estrogen receptor positive (CMS/HHS-HCC)   Left mastectomy  I think she can tolerate surgery at this point and this would be ideal as no reason she is not going to live at least several years. We did discuss antiestrogens alone but data would support surgery if tolerated.  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 Would be difficult to do nodes, they are clinically and radiologically negative. I think in ideal world we could do  this but don't think it is going to make a difference and dont want to even put her at the small risk of lymphedema honestly.  Only way to remove this is with left mastectomy. We discussed surgery, risks and recovery. We all agree to proceed

## 2022-12-10 ENCOUNTER — Encounter (HOSPITAL_COMMUNITY): Payer: Self-pay | Admitting: General Surgery

## 2022-12-10 DIAGNOSIS — Z8673 Personal history of transient ischemic attack (TIA), and cerebral infarction without residual deficits: Secondary | ICD-10-CM | POA: Diagnosis not present

## 2022-12-10 DIAGNOSIS — I4891 Unspecified atrial fibrillation: Secondary | ICD-10-CM | POA: Diagnosis not present

## 2022-12-10 DIAGNOSIS — I10 Essential (primary) hypertension: Secondary | ICD-10-CM | POA: Diagnosis not present

## 2022-12-10 DIAGNOSIS — Z7982 Long term (current) use of aspirin: Secondary | ICD-10-CM | POA: Diagnosis not present

## 2022-12-10 DIAGNOSIS — Z170 Estrogen receptor positive status [ER+]: Secondary | ICD-10-CM | POA: Diagnosis not present

## 2022-12-10 DIAGNOSIS — Z87891 Personal history of nicotine dependence: Secondary | ICD-10-CM | POA: Diagnosis not present

## 2022-12-10 DIAGNOSIS — Z79899 Other long term (current) drug therapy: Secondary | ICD-10-CM | POA: Diagnosis not present

## 2022-12-10 DIAGNOSIS — C50112 Malignant neoplasm of central portion of left female breast: Secondary | ICD-10-CM | POA: Diagnosis not present

## 2022-12-10 DIAGNOSIS — R2681 Unsteadiness on feet: Secondary | ICD-10-CM | POA: Diagnosis not present

## 2022-12-10 NOTE — Progress Notes (Signed)
1 Day Post-Op   Subjective/Chief Complaint: No pain , tol diet, has been up   Objective: Vital signs in last 24 hours: Temp:  [97.4 F (36.3 C)-98.8 F (37.1 C)] 97.4 F (36.3 C) (08/02 0505) Pulse Rate:  [47-62] 60 (08/02 0505) Resp:  [12-22] 18 (08/02 0505) BP: (106-166)/(49-91) 139/53 (08/02 0505) SpO2:  [95 %-99 %] 97 % (08/02 0505) Weight:  [66.7 kg] 66.7 kg (08/01 0758) Last BM Date : 12/08/22  Intake/Output from previous day: 08/01 0701 - 08/02 0700 In: 900 [I.V.:800; IV Piggyback:100] Out: 120 [Drains:95; Blood:25] Intake/Output this shift: No intake/output data recorded.  Left mastectomy without hematoma, drain as expected  Lab Results:  No results for input(s): "WBC", "HGB", "HCT", "PLT" in the last 72 hours. BMET No results for input(s): "NA", "K", "CL", "CO2", "GLUCOSE", "BUN", "CREATININE", "CALCIUM" in the last 72 hours. PT/INR No results for input(s): "LABPROT", "INR" in the last 72 hours. ABG No results for input(s): "PHART", "HCO3" in the last 72 hours.  Invalid input(s): "PCO2", "PO2"  Studies/Results: No results found.  Anti-infectives: Anti-infectives (From admission, onward)    Start     Dose/Rate Route Frequency Ordered Stop   12/09/22 0800  ceFAZolin (ANCEF) IVPB 2g/100 mL premix        2 g 200 mL/hr over 30 Minutes Intravenous On call to O.R. 12/09/22 0745 12/09/22 0838   12/09/22 0735  ceFAZolin (ANCEF) 2-4 GM/100ML-% IVPB       Note to Pharmacy: Darrick Huntsman: cabinet override      12/09/22 0735 12/09/22 0847       Assessment/Plan: POD 1 left mastectomy -can go home if cleared by pt later today   Emelia Loron 12/10/2022

## 2022-12-10 NOTE — Evaluation (Signed)
Physical Therapy Evaluation and Discharge Patient Details Name: Sophia Woods MRN: [id number] DOB: [birth year] Today's Date: 12/10/2022  History of Present Illness  The pt is an 82 yo female presenting 8/1 for L total mastectomy due to L breast cancer. PMH includes: aneurysm, arthritis, stroke, HLD, HTN, and pulmonary HTN.   Clinical Impression  Pt in bed upon arrival of PT, agreeable to evaluation at this time. Prior to admission the pt was mobilizing with use of RW in her home, and reports independence with ADLs and able to assist family with IADLs. The pt was able to complete bed mobility and transfers without any additional physical assistance, and was able to complete 250 ft hallway ambulation with use of RW and supervision. The pt has good family support and feels her mobility is at baseline. All education completed. She has all needed DME already, and is safe to return home without additional follow up or equipment. No further acute PT needs, thank you for involving [place] in her care.      If plan is discharge home, recommend the following: A little help with walking and/or transfers;Assistance with cooking/housework;Help with stairs or ramp for entrance   Can travel by private vehicle        Equipment Recommendations None recommended by PT  Recommendations for Other Services       Functional Status Assessment Patient has not had a recent decline in their functional status     Precautions / Restrictions Precautions Precautions: Fall Restrictions Weight Bearing Restrictions: No      Mobility  Bed Mobility Overal bed mobility: Independent                  Transfers Overall transfer level: Needs assistance Equipment used: Rolling walker (2 wheels) Transfers: Sit to/from Stand Sit to Stand: Supervision                Ambulation/Gait Ambulation/Gait assistance: Supervision Gait Distance (Feet): 250 Feet Assistive device: Rolling walker (2 wheels) Gait  Pattern/deviations: Step-through pattern, Decreased stride length Gait velocity: decreased     General Gait Details: pt with slight trunk flexion and small steps. slowed pace but reports this is baseline     Balance Overall balance assessment: Needs assistance Sitting-balance support: No upper extremity supported Sitting balance-Leahy Scale: Good     Standing balance support: Bilateral upper extremity supported, During functional activity Standing balance-Leahy Scale: Poor                               Pertinent Vitals/Pain Pain Assessment Pain Assessment: No/denies pain    Home Living Family/patient expects to be discharged to:: Private residence Living Arrangements: Alone (son stays on weekdays) Available Help at Discharge: Family;Available 24 hours/day Type of Home: House Home Access: Stairs to enter Entrance Stairs-Rails: None Entrance Stairs-Number of Steps: 1   Home Layout: One level Home Equipment: Agricultural consultant (2 wheels);Toilet riser;Tub bench;Rollator (4 wheels);Cane - single point      Prior Function Prior Level of Function : Independent/Modified Independent             Mobility Comments: use of RW, single fall getting out of car. ADLs Comments: independent other than socls/shoes     Hand Dominance   Dominant Hand: Right    Extremity/Trunk Assessment   Upper Extremity Assessment Upper Extremity Assessment: LUE deficits/detail LUE Deficits / Details: hx of L frozen shoulder    Lower Extremity Assessment Lower Extremity Assessment:  Generalized weakness (bilateral edema, pt limited slightly in LLE due to pain but grossly functional with mobility)    Cervical / Trunk Assessment Cervical / Trunk Assessment: Normal  Communication   Communication: HOH  Cognition Arousal/Alertness: Awake/alert Behavior During Therapy: WFL for tasks assessed/performed Overall Cognitive Status: Within Functional Limits for tasks assessed                                  General Comments: limited at times by [facility], but pt does well when able to hear        General Comments General comments (skin integrity, edema, etc.): VSS on RA        Assessment/Plan    PT Assessment Patient does not need any further PT services         PT Goals (Current goals can be found in the Care Plan section)  Acute Rehab PT Goals Patient Stated Goal: return to independence PT Goal Formulation: With patient Time For Goal Achievement: 12/24/22 Potential to Achieve Goals: Good     AM-PAC PT "6 Clicks" Mobility  Outcome Measure Help needed turning from your back to your side while in a flat bed without using bedrails?: None Help needed moving from lying on your back to sitting on the side of a flat bed without using bedrails?: None Help needed moving to and from a bed to a chair (including a wheelchair)?: None Help needed standing up from a chair using your arms (e.g., wheelchair or bedside chair)?: A Little Help needed to walk in hospital room?: A Little Help needed climbing 3-5 steps with a railing? : A Little 6 Click Score: 21    End of Session Equipment Utilized During Treatment: Gait belt Activity Tolerance: Patient tolerated treatment well Patient left: in bed;with call bell/phone within reach;with family/visitor present Nurse Communication: Mobility status PT Visit Diagnosis: Unsteadiness on feet (R26.81)    Time: 4098-1191 PT Time Calculation (min) (ACUTE ONLY): 31 min   Charges:   PT Evaluation $PT Eval Low Complexity: 1 Low PT Treatments $Therapeutic Activity: 8-22 mins PT General Charges $$ ACUTE PT VISIT: 1 Visit         Vickki Muff, PT, DPT   Acute Rehabilitation Department Office [phone] Secure Chat Communication Preferred  Sophia Woods 12/10/2022, 12:53 PM

## 2022-12-13 NOTE — Discharge Summary (Signed)
Physician Discharge Summary  Patient ID: Sophia Woods MRN: [id number] DOB/AGE: 1941-04-25 82 y.o.  Admit date: 12/09/2022 Discharge date: 12/13/2022  Admission Diagnoses: Breast cancer History afib HTN History stroke  Discharge Diagnoses:  Principal Problem:   Breast cancer (HCC) Active Problems:   S/P left mastectomy   Discharged Condition: good  Hospital Course: 32 yof with locally advanced breast cancer underwent uncomplicated left mastectomy. She is doing well following day. PT has cleared her and will be discharged  Consults: None  Significant Diagnostic Studies: none  Treatments: surgery: left mastectomy   Disposition: Discharge disposition: 01-Home or Self Care        Allergies as of 12/10/2022       Reactions   Penicillins    Hives   Bactrim [sulfamethoxazole-trimethoprim] Swelling        Medication List     TAKE these medications    acetaminophen 500 MG tablet Commonly known as: TYLENOL Take 1,000 mg by mouth every 8 (eight) hours as needed for moderate pain.   aspirin 81 MG tablet Take 81 mg by mouth daily.   atorvastatin 20 MG tablet Commonly known as: LIPITOR Take 20 mg by mouth daily.   diltiazem 120 MG 24 hr capsule Commonly known as: DILACOR XR Take 120 mg by mouth daily.   furosemide 20 MG tablet Commonly known as: LASIX Take 20 mg by mouth daily.   ibuprofen 200 MG tablet Commonly known as: ADVIL Take 400 mg by mouth every 8 (eight) hours as needed for moderate pain.   levothyroxine 150 MCG tablet Commonly known as: SYNTHROID Take 150 mcg by mouth daily before breakfast.   losartan 100 MG tablet Commonly known as: COZAAR Take 25 mg by mouth daily.   metoprolol tartrate 25 MG tablet Commonly known as: LOPRESSOR Take 25 mg by mouth 2 (two) times daily.   traMADol 50 MG tablet Commonly known as: ULTRAM Take 50 mg by mouth 2 (two) times daily as needed for severe pain.        Follow-up Information     Emelia Loron, MD Follow up in 2 week(s).   Specialty: General Surgery Contact information: [address] [place] [address] [phone]                 Signed: Emelia Loron 12/13/2022, 8:39 AM

## 2022-12-14 ENCOUNTER — Encounter: Payer: Self-pay | Admitting: *Deleted

## 2022-12-21 ENCOUNTER — Encounter: Payer: Self-pay | Admitting: *Deleted

## 2022-12-21 DIAGNOSIS — Z170 Estrogen receptor positive status [ER+]: Secondary | ICD-10-CM

## 2023-01-04 ENCOUNTER — Inpatient Hospital Stay: Payer: Medicare Other | Attending: Hematology and Oncology | Admitting: Hematology and Oncology

## 2023-01-04 VITALS — BP 132/43 | HR 58 | Temp 97.2°F | Resp 18 | Ht 64.02 in | Wt 152.1 lb

## 2023-01-04 DIAGNOSIS — Z79811 Long term (current) use of aromatase inhibitors: Secondary | ICD-10-CM | POA: Diagnosis not present

## 2023-01-04 DIAGNOSIS — Z170 Estrogen receptor positive status [ER+]: Secondary | ICD-10-CM

## 2023-01-04 DIAGNOSIS — C50412 Malignant neoplasm of upper-outer quadrant of left female breast: Secondary | ICD-10-CM | POA: Diagnosis not present

## 2023-01-04 DIAGNOSIS — Z9012 Acquired absence of left breast and nipple: Secondary | ICD-10-CM | POA: Insufficient documentation

## 2023-01-04 MED ORDER — ANASTROZOLE 1 MG PO TABS: 1.0000 mg | ORAL_TABLET | Freq: Every day | ORAL | 3 refills | Status: DC

## 2023-01-04 NOTE — Progress Notes (Signed)
Patient Care Team: Merri Brunette, MD as PCP - General (Family Medicine) Pershing Proud, RN as Oncology Nurse Navigator Donnelly Angelica, RN as Oncology Nurse Navigator [name], MD as Consulting Physician (Hematology and Oncology)  DIAGNOSIS:  Encounter Diagnosis  Name Primary?   Malignant neoplasm of upper-outer quadrant of left breast in female, estrogen receptor positive (HCC) Yes    SUMMARY OF ONCOLOGIC HISTORY: Oncology History  Malignant neoplasm of upper-outer quadrant of left female breast (HCC)  11/18/2022 Initial Diagnosis   Palpable left breast mass retroareolar region by ultrasound 2 masses were identified.  5.1 cm retroareolar and a 0.8 cm superior to this.  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%, PR 0%, Ki67 40%, HER2 negative; biopsy smaller mass: Grade 1 IDC ER 100%, PR 0%, HER2 negative, Ki-67 10%, axilla negative   11/24/2022 Cancer Staging   Staging form: Breast, AJCC 8th Edition - Clinical stage from 11/24/2022: Stage IIB (cT3, cN0, cM0, G1, ER+, PR-, HER2-) - Signed by [name], MD on 11/24/2022 Stage prefix: Initial diagnosis Histologic grading system: 3 grade system Laterality: Left Staged by: Pathologist and managing physician Stage used in treatment planning: Yes National guidelines used in treatment planning: Yes Type of national guideline used in treatment planning: NCCN     CHIEF COMPLIANT: Post op  INTERVAL HISTORY: Sophia Woods is a 82 y.o. female is here because of recent diagnosis of left breast cancer. Patient reports surgery went well. She had no complaints or side effects. Denies any pain did not have to take any pain medication.  ALLERGIES:  is allergic to penicillins and bactrim [sulfamethoxazole-trimethoprim].  MEDICATIONS:  Current Outpatient Medications  Medication Sig Dispense Refill   acetaminophen (TYLENOL) 500 MG tablet Take 1,000 mg by mouth every 8 (eight) hours as needed for moderate  pain.     anastrozole (ARIMIDEX) 1 MG tablet Take 1 tablet (1 mg total) by mouth daily. 90 tablet 3   aspirin 81 MG tablet Take 81 mg by mouth daily.     atorvastatin (LIPITOR) 20 MG tablet Take 20 mg by mouth daily.     diltiazem (DILACOR XR) 120 MG 24 hr capsule Take 120 mg by mouth daily.     furosemide (LASIX) 20 MG tablet Take 20 mg by mouth daily.     ibuprofen (ADVIL) 200 MG tablet Take 400 mg by mouth every 8 (eight) hours as needed for moderate pain.     levothyroxine (SYNTHROID) 150 MCG tablet Take 150 mcg by mouth daily before breakfast.     losartan (COZAAR) 100 MG tablet Take 25 mg by mouth daily.     metoprolol tartrate (LOPRESSOR) 25 MG tablet Take 25 mg by mouth 2 (two) times daily.     traMADol (ULTRAM) 50 MG tablet Take 50 mg by mouth 2 (two) times daily as needed for severe pain.     No current facility-administered medications for this visit.   Facility-Administered Medications Ordered in Other Visits  Medication Dose Route Frequency Provider Last Rate Last Admin   tranexamic acid (CYKLOKAPRON) 3,000 mg in sodium chloride 0.9 % 100 mL Topical Application  3,000 mg Topical Once Emelia Loron, MD        PHYSICAL EXAMINATION: ECOG PERFORMANCE STATUS: 1 - Symptomatic but completely ambulatory  Vitals:   01/04/23 1354 01/04/23 1355  BP: (!) 128/35 (!) 132/43  Pulse:    Resp:    Temp:    SpO2:  Filed Weights   01/04/23 1353  Weight: 152 lb 1.6 oz (69 kg)      LABORATORY DATA:  I have reviewed the data as listed    Latest Ref Rng & Units 11/24/2022   12:45 PM 11/17/2021    2:40 PM 08/12/2010    7:28 AM  CMP  Glucose 70 - 99 mg/dL 90  99  94   BUN 8 - 23 mg/dL 60  27  5   Creatinine 0.44 - 1.00 mg/dL 1.30  8.65  7.84   Sodium 135 - 145 mmol/L 143  141  140   Potassium 3.5 - 5.1 mmol/L 4.7  4.0  3.5   Chloride 98 - 111 mmol/L 113  112  107   CO2 22 - 32 mmol/L 25  18  28    Calcium 8.9 - 10.3 mg/dL 9.3  7.8  8.5   Total Protein 6.5 - 8.1 g/dL 6.5    5.2   Total Bilirubin 0.3 - 1.2 mg/dL 0.4   0.7   Alkaline Phos 38 - 126 U/L 53   36   AST 15 - 41 U/L 15   11   ALT 0 - 44 U/L 13   10     Lab Results  Component Value Date   WBC 9.6 11/24/2022   HGB 10.4 (L) 11/24/2022   HCT 32.9 (L) 11/24/2022   MCV 97.3 11/24/2022   PLT 221 11/24/2022   NEUTROABS 3.0 11/24/2022    ASSESSMENT & PLAN:  Malignant neoplasm of upper-outer quadrant of left female breast (HCC) 12/09/2022: Left mastectomy: Grade 3 IDC 5.3 cm, margins negative, ER 100%, PR 0%, HER2 negative, Ki-67 40%  Pathology counseling: I discussed the final pathology report of the patient provided  a copy of this report. I discussed the margins as well as lymph node surgeries. We also discussed the final staging along with previously performed ER/PR and HER-2/neu testing.  Treatment plan: Antiestrogen therapy with anastrozole 1 mg daily x 5 years  Anastrozole counseling: We discussed the risks and benefits of anti-estrogen therapy with aromatase inhibitors. These include but not limited to insomnia, hot flashes, mood changes, vaginal dryness, bone density loss, and weight gain. We strongly believe that the benefits far outweigh the risks. Patient understands these risks and consented to starting treatment. Planned treatment duration is 5 years.  Return to in 3 months for survivorship care plan visit    No orders of the defined types were placed in this encounter.  The patient has a good understanding of the overall plan. she agrees with it. she will call with any problems that may develop before the next visit here. Total time spent: 30 mins including face to face time and time spent for planning, charting and co-ordination of care   Tamsen Meek, MD 01/04/23    I Janan Ridge am acting as a Neurosurgeon for [employer]  I have reviewed the above documentation for accuracy and completeness, and I agree with the above.

## 2023-01-04 NOTE — Assessment & Plan Note (Signed)
12/09/2022: Left mastectomy: Grade 3 IDC 5.3 cm, margins negative, ER 100%, PR 0%, HER2 negative, Ki-67 40%  Pathology counseling: I discussed the final pathology report of the patient provided  a copy of this report. I discussed the margins as well as lymph node surgeries. We also discussed the final staging along with previously performed ER/PR and HER-2/neu testing.  Treatment plan: Antiestrogen therapy with anastrozole 1 mg daily x 5 years  Anastrozole counseling: We discussed the risks and benefits of anti-estrogen therapy with aromatase inhibitors. These include but not limited to insomnia, hot flashes, mood changes, vaginal dryness, bone density loss, and weight gain. We strongly believe that the benefits far outweigh the risks. Patient understands these risks and consented to starting treatment. Planned treatment duration is 5 years.  Return to in 3 months for survivorship care plan visit

## 2023-01-14 ENCOUNTER — Encounter: Payer: Self-pay | Admitting: *Deleted

## 2023-03-14 DIAGNOSIS — E039 Hypothyroidism, unspecified: Secondary | ICD-10-CM | POA: Diagnosis not present

## 2023-04-01 ENCOUNTER — Inpatient Hospital Stay: Payer: Medicare Other | Attending: Adult Health | Admitting: Adult Health

## 2023-04-01 ENCOUNTER — Encounter: Payer: Self-pay | Admitting: Adult Health

## 2023-04-01 VITALS — BP 169/58 | HR 64 | Temp 97.6°F | Resp 16 | Wt 159.8 lb

## 2023-04-01 DIAGNOSIS — Z801 Family history of malignant neoplasm of trachea, bronchus and lung: Secondary | ICD-10-CM | POA: Diagnosis not present

## 2023-04-01 DIAGNOSIS — Z87891 Personal history of nicotine dependence: Secondary | ICD-10-CM | POA: Insufficient documentation

## 2023-04-01 DIAGNOSIS — Z9012 Acquired absence of left breast and nipple: Secondary | ICD-10-CM | POA: Diagnosis not present

## 2023-04-01 DIAGNOSIS — Z808 Family history of malignant neoplasm of other organs or systems: Secondary | ICD-10-CM | POA: Diagnosis not present

## 2023-04-01 DIAGNOSIS — Z170 Estrogen receptor positive status [ER+]: Secondary | ICD-10-CM | POA: Diagnosis not present

## 2023-04-01 DIAGNOSIS — Z79811 Long term (current) use of aromatase inhibitors: Secondary | ICD-10-CM | POA: Insufficient documentation

## 2023-04-01 DIAGNOSIS — R351 Nocturia: Secondary | ICD-10-CM | POA: Insufficient documentation

## 2023-04-01 DIAGNOSIS — Z803 Family history of malignant neoplasm of breast: Secondary | ICD-10-CM | POA: Diagnosis not present

## 2023-04-01 DIAGNOSIS — Z800 Family history of malignant neoplasm of digestive organs: Secondary | ICD-10-CM | POA: Diagnosis not present

## 2023-04-01 DIAGNOSIS — C50412 Malignant neoplasm of upper-outer quadrant of left female breast: Secondary | ICD-10-CM | POA: Diagnosis not present

## 2023-04-01 NOTE — Progress Notes (Addendum)
SURVIVORSHIP VISIT:  BRIEF ONCOLOGIC HISTORY:  Oncology History  Malignant neoplasm of upper-outer quadrant of left female breast (HCC)  11/18/2022 Initial Diagnosis   Palpable left breast mass retroareolar region by ultrasound 2 masses were identified.  5.1 cm retroareolar and a 0.8 cm superior to this.  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%, PR 0%, Ki67 40%, HER2 negative; biopsy smaller mass: Grade 1 IDC ER 100%, PR 0%, HER2 negative, Ki-67 10%, axilla negative   11/24/2022 Cancer Staging   Staging form: Breast, AJCC 8th Edition - Clinical stage from 11/24/2022: Stage IIB (cT3, cN0, cM0, G1, ER+, PR-, HER2-) - Signed by [name], MD on 11/24/2022 Stage prefix: Initial diagnosis Histologic grading system: 3 grade system Laterality: Left Staged by: Pathologist and managing physician Stage used in treatment planning: Yes National guidelines used in treatment planning: Yes Type of national guideline used in treatment planning: NCCN   01/2023 -  Anti-estrogen oral therapy   Anastrozole x 5 years     INTERVAL HISTORY:    Discussed the use of AI scribe software for clinical note transcription with the patient, who gave verbal consent to proceed.  Sophia Woods to review her survivorship care plan detailing her treatment course for breast cancer, as well as monitoring long-term side effects of that treatment, education regarding health maintenance, screening, and overall wellness and health promotion.     Overall, [name] reports feeling quite well.  Is taking anastrozole daily however associates this with increased urination at night.  Her son wonders if it has to do with her dependent lower extremity swelling and the fact that she infrequently elevates her legs during the day and when she is lying flat at night she is able to elevate her legs more which leads to urination of the extra fluid on her body.  She denies any dysuria, hematuria, malodorous  urine, or any other urinary symptoms or concerns today.  She otherwise is tolerating the anastrozole well.  REVIEW OF SYSTEMS:  Review of Systems  Constitutional:  Negative for appetite change, chills, fatigue, fever and unexpected weight change.  HENT:   Negative for hearing loss, lump/mass and trouble swallowing.   Eyes:  Negative for eye problems and icterus.  Respiratory:  Negative for chest tightness, cough and shortness of breath.   Cardiovascular:  Positive for leg swelling. Negative for chest pain and palpitations.  Gastrointestinal:  Negative for abdominal distention, abdominal pain, constipation, diarrhea, nausea and vomiting.  Endocrine: Negative for hot flashes.  Genitourinary:  Positive for nocturia. Negative for difficulty urinating.   Musculoskeletal:  Negative for arthralgias.  Skin:  Negative for itching and rash.  Neurological:  Negative for dizziness, extremity weakness, headaches and numbness.  Hematological:  Negative for adenopathy. Does not bruise/bleed easily.  Psychiatric/Behavioral:  Negative for depression. The patient is not nervous/anxious.   Breast: Denies any new nodularity, masses, tenderness, nipple changes, or nipple discharge.       PAST MEDICAL/SURGICAL HISTORY:  Past Medical History:  Diagnosis Date   Aortic stenosis    moderate AS 12/03/22 echo   Atrial fibrillation (HCC) 05/13/2009   Cancer (HCC)    left breast cancer 11/18/22   CKD (chronic kidney disease)    Complication of anesthesia    Family history of breast cancer    Family history of prostate cancer    Family history of stomach cancer    Headache    migraine - years ago   Heart murmur  Hemothorax, left    in setting supratherapeutic INR, s/p left VATS, drainage of effusion & decortication 06/27/09   History of blood transfusion    HOH (hard of hearing)    Hypercholesteremia    Hypertension    Hypothyroidism    Lupus (HCC)    scalp   Obesity    PONV (postoperative nausea and  vomiting)    Stroke [facility])    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   Past Surgical History:  Procedure Laterality Date   BREAST BIOPSY Left 11/18/2022   [place] LT BREAST BX W LOC DEV 1ST LESION IMG BX SPEC US GUIDE 11/18/2022 [facility]   BREAST BIOPSY Left 11/18/2022   [place] LT BREAST BX W LOC DEV EA ADD LESION IMG BX SPEC US GUIDE 11/18/2022 [facility] MAMMOGRAP[facility]   CHOLECYSTECTOMY     eye irritation/pterygium removed     HERNIA REPAIR     SIMPLE MASTECTOMY WITH AXILLARY SENTINEL NODE BIOPSY Left 12/09/2022   Procedure: LEFT MASTECTOMY;  Surgeon: Emelia Loron, MD;  Location: [facility] OR;  Service: General;  Laterality: Left;  GEN (TIVA) w/ PEC BLOCK   TOE SURGERY       ALLERGIES:  Allergies  Allergen Reactions   Penicillins     Hives   Bactrim [Sulfamethoxazole-Trimethoprim] Swelling     CURRENT MEDICATIONS:  Outpatient Encounter Medications as of 04/01/2023  Medication Sig   acetaminophen (TYLENOL) 500 MG tablet Take 1,000 mg by mouth every 8 (eight) hours as needed for moderate pain.   anastrozole (ARIMIDEX) 1 MG tablet Take 1 tablet (1 mg total) by mouth daily.   aspirin 81 MG tablet Take 81 mg by mouth daily.   atorvastatin (LIPITOR) 20 MG tablet Take 20 mg by mouth daily.   diltiazem (DILACOR XR) 120 MG 24 hr capsule Take 120 mg by mouth daily.   furosemide (LASIX) 20 MG tablet Take 20 mg by mouth daily.   ibuprofen (ADVIL) 200 MG tablet Take 400 mg by mouth every 8 (eight) hours as needed for moderate pain.   levothyroxine (SYNTHROID) 150 MCG tablet Take 150 mcg by mouth daily before breakfast.   losartan (COZAAR) 100 MG tablet Take 25 mg by mouth daily.   metoprolol tartrate (LOPRESSOR) 25 MG tablet Take 25 mg by mouth 2 (two) times daily.   traMADol (ULTRAM) 50 MG tablet Take 50 mg by mouth 2 (two) times daily as needed for severe pain.   Facility-Administered Encounter Medications as of 04/01/2023  Medication   tranexamic acid (CYKLOKAPRON) 3,000 mg in  sodium chloride 0.9 % 100 mL Topical Application     ONCOLOGIC FAMILY HISTORY:  Family History  Problem Relation Age of Onset   Hypertension Mother    Cancer - Prostate Father        dx 43s   Breast cancer Sister 11   Stomach cancer Maternal Aunt    Breast cancer Maternal Aunt    Cancer Maternal Aunt        NOS   Stomach cancer Maternal Uncle    Lung cancer Maternal Uncle    Bone cancer Maternal Uncle    Cancer Maternal Uncle        started in his ear   Stomach cancer Paternal Grandfather    Prostate cancer Son 72   Breast cancer Cousin        mat first cousin     SOCIAL HISTORY:  Social History   Socioeconomic History   Marital status: Divorced    Spouse  name: Not on file   Number of children: Not on file   Years of education: Not on file   Highest education level: Not on file  Occupational History   Not on file  Tobacco Use   Smoking status: Former    Current packs/day: 0.00    Types: Cigarettes    Quit date: 10/14/1973    Years since quitting: 49.4   Smokeless tobacco: Not on file  Vaping Use   Vaping status: Never Used  Substance and Sexual Activity   Alcohol use: Not Currently   Drug use: Not Currently   Sexual activity: Not on file  Other Topics Concern   Not on file  Social History Narrative   Not on file   Social Determinants of Health   Financial Resource Strain: Not on file  Food Insecurity: No Food Insecurity (12/09/2022)   Hunger Vital Sign    Worried About Running Out of Food in the Last Year: Never true    Ran Out of Food in the Last Year: Never true  Transportation Needs: No Transportation Needs (12/09/2022)   PRAPARE - Administrator, Civil Service (Medical): No    Lack of Transportation (Non-Medical): No  Physical Activity: Not on file  Stress: Not on file  Social Connections: Unknown (09/22/2021)   Received from [facility], [facility]   Social Network    Social Network: Not on file  Intimate Partner Violence: Not At  Risk (12/09/2022)   Humiliation, Afraid, Rape, and Kick questionnaire    Fear of Current or Ex-Partner: No    Emotionally Abused: No    Physically Abused: No    Sexually Abused: No     OBSERVATIONS/OBJECTIVE:  BP (!) 169/58 (BP Location: Right Arm, Patient Position: Sitting) Comment: 169/58 1st BP; 166/42 2nd BP  Pulse 64   Temp 97.6 F (36.4 C) (Temporal)   Resp 16   Wt 159 lb 12.8 oz (72.5 kg)   SpO2 95%   BMI 27.41 kg/m  GENERAL: Patient is a well appearing female in no acute distress HEENT:  Sclerae anicteric.  Oropharynx clear and moist. No ulcerations or evidence of oropharyngeal candidiasis. Neck is supple.  NODES:  No cervical, supraclavicular, or axillary lymphadenopathy palpated.  BREAST EXAM: Breast status postmastectomy, no sign of local recurrence right breast is benign. LUNGS:  Clear to auscultation bilaterally.  No wheezes or rhonchi. HEART:  Regular rate and rhythm. No murmur appreciated. ABDOMEN:  Soft, nontender.  Positive, normoactive bowel sounds. No organomegaly palpated. MSK:  No focal spinal tenderness to palpation. Full range of motion bilaterally in the upper extremities. EXTREMITIES:  No peripheral edema.   SKIN:  Clear with no obvious rashes or skin changes. No nail dyscrasia. NEURO:  Nonfocal. Well oriented.  Appropriate affect.   LABORATORY DATA:  None for this visit.  DIAGNOSTIC IMAGING:  None for this visit.      ASSESSMENT AND PLAN:  Ms.. Sophia Woods is a pleasant 82 y.o. 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  She presents to the Survivorship Clinic for our initial meeting and routine follow-up post-completion of treatment for breast cancer.    1. Stage 2B left breast cancer:  Ms. Mcpeek is continuing to recover from definitive treatment for breast cancer. She will follow-up with her medical  oncologist, Dr. Pamelia Hoit in 6 months with history and physical exam per surveillance protocol.  She  will continue her anti-estrogen therapy with anastrozole. Thus far, she is tolerating the anastrozole well, with minimal side effects.  I recommended that she continue annual right breast screening mammograms next due in July 2025.  Today, a comprehensive survivorship care plan and treatment summary was reviewed with the patient today detailing her breast cancer diagnosis, treatment course, potential late/long-term effects of treatment, appropriate follow-up care with recommendations for the future, and patient education resources.  A copy of this summary, along with a letter will be sent to the patient's primary care provider via mail/fax/In Basket message after today's visit.    2. Nocturia: Likely secondary to her lower extremity swelling.  I suggested she consider compression hose throughout the day and also elevating her legs at least twice throughout the day for at least 15 to 30 minutes at a time.  3. Bone health:  Given Ms. Hinderer's age/history of breast cancer and her current treatment regimen including anti-estrogen therapy with anastrozole, she is at risk for bone demineralization.  Her last DEXA scan was 2017 and we do not have the results.  This was completed at [facility].  The patient and her son follow-up with her PCP at the beginning of the year.  I recommended that they ask for bone density testing at that time.  I recommend that she stay at the facility she has always gone to that way we can understand if there is a decline in her bone density.  She was given education on specific activities to promote bone health.  4. Cancer screening:  Due to Ms. Neisler's history and her age, she should receive screening for skin cancers.  The information and recommendations are listed on the patient's comprehensive care plan/treatment summary and were reviewed in detail with the patient.    5. Health maintenance  and wellness promotion: Ms. Maric was encouraged to consume 5-7 servings of fruits and vegetables per day. We reviewed the "Nutrition Rainbow" handout.  She was also encouraged to engage in moderate to vigorous exercise for 30 minutes per day most days of the week.  She was instructed to limit her alcohol consumption and continue to abstain from tobacco use.     6. Support services/counseling: It is not uncommon for this period of the patient's cancer care trajectory to be one of many emotions and stressors.   She was given information regarding our available services and encouraged to contact me with any questions or for help enrolling in any of our support group/programs.    Follow up instructions:    -Return to cancer center in 6 months for f/u with Dr. Pamelia Hoit  -Mammogram due in 11/2023 -She is welcome to return back to the Survivorship Clinic at any time; no additional follow-up needed at this time.  -Consider referral back to survivorship as a long-term survivor for continued surveillance  The patient was provided an opportunity to ask questions and all were answered. The patient agreed with the plan and demonstrated an understanding of the instructions.   Total encounter time:45 minutes*in face-to-face visit time, chart review, lab review, care coordination, order entry, and documentation of the encounter time.    Lillard Anes, NP 04/01/23 11:08 AM Medical Oncology and Hematology [facility] [address] [place], [place] [address] Tel. [phone]    Fax. [phone]  *Total Encounter Time as defined by the Centers for Medicare and Medicaid Services includes, in addition to the face-to-face time of a patient visit (documented in the note above) non-face-to-face time:  obtaining and reviewing outside history, ordering and reviewing medications, tests or procedures, care coordination (communications with other health care professionals or caregivers) and documentation in  the medical record.

## 2023-06-13 DIAGNOSIS — E039 Hypothyroidism, unspecified: Secondary | ICD-10-CM | POA: Diagnosis not present

## 2023-06-21 DIAGNOSIS — C50912 Malignant neoplasm of unspecified site of left female breast: Secondary | ICD-10-CM | POA: Diagnosis not present

## 2023-06-21 DIAGNOSIS — I480 Paroxysmal atrial fibrillation: Secondary | ICD-10-CM | POA: Diagnosis not present

## 2023-06-21 DIAGNOSIS — E039 Hypothyroidism, unspecified: Secondary | ICD-10-CM | POA: Diagnosis not present

## 2023-06-21 DIAGNOSIS — E785 Hyperlipidemia, unspecified: Secondary | ICD-10-CM | POA: Diagnosis not present

## 2023-06-21 DIAGNOSIS — M129 Arthropathy, unspecified: Secondary | ICD-10-CM | POA: Diagnosis not present

## 2023-06-21 DIAGNOSIS — R609 Edema, unspecified: Secondary | ICD-10-CM | POA: Diagnosis not present

## 2023-06-21 DIAGNOSIS — I10 Essential (primary) hypertension: Secondary | ICD-10-CM | POA: Diagnosis not present

## 2023-07-25 DIAGNOSIS — N184 Chronic kidney disease, stage 4 (severe): Secondary | ICD-10-CM | POA: Diagnosis not present

## 2023-09-19 DIAGNOSIS — C50912 Malignant neoplasm of unspecified site of left female breast: Secondary | ICD-10-CM | POA: Diagnosis not present

## 2023-10-10 DIAGNOSIS — C50912 Malignant neoplasm of unspecified site of left female breast: Secondary | ICD-10-CM | POA: Diagnosis not present

## 2023-11-03 DIAGNOSIS — Z0000 Encounter for general adult medical examination without abnormal findings: Secondary | ICD-10-CM | POA: Diagnosis not present

## 2023-11-03 DIAGNOSIS — E039 Hypothyroidism, unspecified: Secondary | ICD-10-CM | POA: Diagnosis not present

## 2023-11-03 DIAGNOSIS — I10 Essential (primary) hypertension: Secondary | ICD-10-CM | POA: Diagnosis not present

## 2023-11-03 DIAGNOSIS — E785 Hyperlipidemia, unspecified: Secondary | ICD-10-CM | POA: Diagnosis not present

## 2023-11-03 DIAGNOSIS — C50912 Malignant neoplasm of unspecified site of left female breast: Secondary | ICD-10-CM | POA: Diagnosis not present

## 2023-11-03 DIAGNOSIS — I480 Paroxysmal atrial fibrillation: Secondary | ICD-10-CM | POA: Diagnosis not present

## 2023-11-03 DIAGNOSIS — M129 Arthropathy, unspecified: Secondary | ICD-10-CM | POA: Diagnosis not present

## 2023-11-03 DIAGNOSIS — Z23 Encounter for immunization: Secondary | ICD-10-CM | POA: Diagnosis not present

## 2023-12-28 ENCOUNTER — Other Ambulatory Visit: Payer: Self-pay | Admitting: Hematology and Oncology

## 2024-04-03 ENCOUNTER — Inpatient Hospital Stay: Payer: Medicare Other | Attending: Hematology and Oncology | Admitting: Hematology and Oncology

## 2024-04-03 VITALS — BP 143/53 | HR 68 | Temp 97.6°F | Resp 17 | Wt 177.8 lb

## 2024-04-03 DIAGNOSIS — Z170 Estrogen receptor positive status [ER+]: Secondary | ICD-10-CM | POA: Diagnosis not present

## 2024-04-03 DIAGNOSIS — Z79811 Long term (current) use of aromatase inhibitors: Secondary | ICD-10-CM | POA: Insufficient documentation

## 2024-04-03 DIAGNOSIS — Z9012 Acquired absence of left breast and nipple: Secondary | ICD-10-CM | POA: Diagnosis not present

## 2024-04-03 DIAGNOSIS — C50412 Malignant neoplasm of upper-outer quadrant of left female breast: Secondary | ICD-10-CM | POA: Insufficient documentation

## 2024-04-03 NOTE — Assessment & Plan Note (Signed)
 12/09/2022: Left mastectomy: Grade 3 IDC 5.3 cm, margins negative, ER 100%, PR 0%, HER2 negative, Ki-67 40%  Treatment plan: Antiestrogen therapy with anastrozole  1 mg daily x 5 years started 01/04/2023   Anastrozole  toxicities:  Breast cancer surveillance: Breast exam 04/03/2024: Benign Mammogram has not been performed this year.  Return to clinic in 1 year for follow-up

## 2024-04-03 NOTE — Progress Notes (Signed)
 Patient Care Team: Claudene Pellet, MD as PCP - General (Family Medicine) Odean Potts, MD as Consulting Physician (Hematology and Oncology) Ebbie Cough, MD as Consulting Physician (General Surgery)  DIAGNOSIS:  Encounter Diagnosis  Name Primary?   Malignant neoplasm of upper-outer quadrant of left breast in female, estrogen receptor positive (HCC) Yes    SUMMARY OF ONCOLOGIC HISTORY: Oncology History  Malignant neoplasm of upper-outer quadrant of left female breast (HCC)  11/18/2022 Initial Diagnosis   Palpable left breast mass retroareolar region by ultrasound 2 masses were identified.  5.1 cm retroareolar and a 0.8 cm superior to this.  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%, PR 0%, Ki67 40%, HER2 negative; biopsy smaller mass: Grade 1 IDC ER 100%, PR 0%, HER2 negative, Ki-67 10%, axilla negative   11/24/2022 Cancer Staging   Staging form: Breast, AJCC 8th Edition - Clinical stage from 11/24/2022: Stage IIB (cT3, cN0, cM0, G1, ER+, PR-, HER2-) - Signed by Odean Potts, MD on 11/24/2022 Stage prefix: Initial diagnosis Histologic grading system: 3 grade system Laterality: Left Staged by: Pathologist and managing physician Stage used in treatment planning: Yes National guidelines used in treatment planning: Yes Type of national guideline used in treatment planning: NCCN   12/09/2022 Surgery   Left mastectomy: Grade 3 IDC 5.3 cm, margins negative, ER 100%, PR 0%, HER2 negative, Ki-67 40%    12/09/2022 Cancer Staging   Staging form: Breast, AJCC 8th Edition - Pathologic stage from 12/09/2022: Stage IIB (pT3, pN0, cM0, G3, ER+, PR-, HER2-) - Signed by Crawford Morna Pickle, NP on 04/01/2023 Histologic grading system: 3 grade system   01/2023 -  Anti-estrogen oral therapy   Anastrozole  x 5 years     CHIEF COMPLIANT: Surveillance of breast cancer anastrozole  therapy  HISTORY OF PRESENT ILLNESS: History of Present Illness Sophia Woods is  an 83 year old female with estrogen receptor-positive breast cancer who presents for a follow-up visit.  She has taken anastrozole  since August of last year. She has intermittent hot flashes at night that wake her from sleep.  She has intermittent joint stiffness in her hands. One finger is most affected, and this started before anastrozole .  She has occasional brief chest discomfort described as a hollow feeling with a little flutter.     ALLERGIES:  is allergic to penicillins and bactrim [sulfamethoxazole-trimethoprim].  MEDICATIONS:  Current Outpatient Medications  Medication Sig Dispense Refill   acetaminophen  (TYLENOL ) 500 MG tablet Take 1,000 mg by mouth every 8 (eight) hours as needed for moderate pain.     anastrozole  (ARIMIDEX ) 1 MG tablet TAKE 1 TABLET BY MOUTH EVERY DAY 90 tablet 3   aspirin 81 MG tablet Take 81 mg by mouth daily.     atorvastatin  (LIPITOR) 20 MG tablet Take 20 mg by mouth daily.     diltiazem  (DILACOR XR ) 120 MG 24 hr capsule Take 120 mg by mouth daily.     furosemide  (LASIX ) 20 MG tablet Take 20 mg by mouth daily.     ibuprofen (ADVIL) 200 MG tablet Take 400 mg by mouth every 8 (eight) hours as needed for moderate pain.     levothyroxine  (SYNTHROID ) 150 MCG tablet Take 150 mcg by mouth daily before breakfast.     losartan  (COZAAR ) 100 MG tablet Take 25 mg by mouth daily.     metoprolol  tartrate (LOPRESSOR ) 25 MG tablet Take 25 mg by mouth 2 (two) times daily.     traMADol  (ULTRAM ) 50  MG tablet Take 50 mg by mouth 2 (two) times daily as needed for severe pain.     No current facility-administered medications for this visit.   Facility-Administered Medications Ordered in Other Visits  Medication Dose Route Frequency Provider Last Rate Last Admin   tranexamic acid  (CYKLOKAPRON ) 3,000 mg in sodium chloride  0.9 % 100 mL Topical Application  3,000 mg Topical Once Ebbie Cough, MD        PHYSICAL EXAMINATION: ECOG PERFORMANCE STATUS: 1 - Symptomatic but  completely ambulatory  Vitals:   04/03/24 1357  BP: (!) 143/53  Pulse: 68  Resp: 17  Temp: 97.6 F (36.4 C)  SpO2: 97%   Filed Weights   04/03/24 1357  Weight: 177 lb 12.8 oz (80.6 kg)    Physical Exam GENERAL: No acute distress, well developed, well nourished.  (exam performed in the presence of a chaperone)  LABORATORY DATA:  I have reviewed the data as listed    Latest Ref Rng & Units 11/24/2022   12:45 PM 11/17/2021    2:40 PM 08/12/2010    7:28 AM  CMP  Glucose 70 - 99 mg/dL 90  99  94   BUN 8 - 23 mg/dL 60  27  5   Creatinine 0.44 - 1.00 mg/dL 8.55  8.53  9.26   Sodium 135 - 145 mmol/L 143  141  140   Potassium 3.5 - 5.1 mmol/L 4.7  4.0  3.5   Chloride 98 - 111 mmol/L 113  112  107   CO2 22 - 32 mmol/L 25  18  28    Calcium  8.9 - 10.3 mg/dL 9.3  7.8  8.5   Total Protein 6.5 - 8.1 g/dL 6.5   5.2   Total Bilirubin 0.3 - 1.2 mg/dL 0.4   0.7   Alkaline Phos 38 - 126 U/L 53   36   AST 15 - 41 U/L 15   11   ALT 0 - 44 U/L 13   10     Lab Results  Component Value Date   WBC 9.6 11/24/2022   HGB 10.4 (L) 11/24/2022   HCT 32.9 (L) 11/24/2022   MCV 97.3 11/24/2022   PLT 221 11/24/2022   NEUTROABS 3.0 11/24/2022    ASSESSMENT & PLAN:  Malignant neoplasm of upper-outer quadrant of left female breast (HCC) 12/09/2022: Left mastectomy: Grade 3 IDC 5.3 cm, margins negative, ER 100%, PR 0%, HER2 negative, Ki-67 40%  Treatment plan: Antiestrogen therapy with anastrozole  1 mg daily x 5 years started 01/04/2023   Anastrozole  toxicities: Tolerating it extremely well without any problems or concerns   Breast cancer surveillance: Breast exam 04/03/2024: Benign Mammogram has not been performed this year.  Patient decided not to do any further mammograms and she will come annually for breast exams and checkups.  If she feels any lumps or nodules in we will obtain additional imaging studies.  Return to clinic in 1 year for follow-up   No orders of the defined types were  placed in this encounter.  The patient has a good understanding of the overall plan. she agrees with it. she will call with any problems that may develop before the next visit here.  I personally spent a total of 30 minutes in the care of the patient today including preparing to see the patient, getting/reviewing separately obtained history, performing a medically appropriate exam/evaluation, counseling and educating, placing orders, referring and communicating with other health care professionals, documenting clinical information in the EHR, independently  interpreting results, communicating results, and coordinating care.   Viinay K Karter Hellmer, MD 04/03/24

## 2025-04-01 ENCOUNTER — Inpatient Hospital Stay: Admitting: Hematology and Oncology
